# Patient Record
Sex: Male | Born: 1970 | Race: Black or African American | Hispanic: No | Marital: Married | Smoking: Never smoker
Health system: Southern US, Community
[De-identification: ages and names within clinical notes are randomized; demographics above are authoritative.]

## PROBLEM LIST (undated history)

## (undated) DIAGNOSIS — B009 Herpesviral infection, unspecified: Secondary | ICD-10-CM

## (undated) DIAGNOSIS — F32A Depression, unspecified: Secondary | ICD-10-CM

## (undated) DIAGNOSIS — K219 Gastro-esophageal reflux disease without esophagitis: Secondary | ICD-10-CM

## (undated) DIAGNOSIS — E785 Hyperlipidemia, unspecified: Secondary | ICD-10-CM

## (undated) DIAGNOSIS — T7840XA Allergy, unspecified, initial encounter: Secondary | ICD-10-CM

## (undated) DIAGNOSIS — F419 Anxiety disorder, unspecified: Secondary | ICD-10-CM

## (undated) DIAGNOSIS — I10 Essential (primary) hypertension: Secondary | ICD-10-CM

## (undated) DIAGNOSIS — K649 Unspecified hemorrhoids: Secondary | ICD-10-CM

## (undated) HISTORY — DX: Unspecified hemorrhoids: K64.9

## (undated) HISTORY — DX: Anxiety disorder, unspecified: F41.9

## (undated) HISTORY — DX: Essential (primary) hypertension: I10

## (undated) HISTORY — DX: Depression, unspecified: F32.A

## (undated) HISTORY — DX: Gastro-esophageal reflux disease without esophagitis: K21.9

## (undated) HISTORY — DX: Herpesviral infection, unspecified: B00.9

## (undated) HISTORY — DX: Allergy, unspecified, initial encounter: T78.40XA

## (undated) HISTORY — PX: COLONOSCOPY: SHX174

## (undated) HISTORY — DX: Hyperlipidemia, unspecified: E78.5

---

## 1997-09-11 ENCOUNTER — Emergency Department (HOSPITAL_COMMUNITY): Admission: EM | Admit: 1997-09-11 | Discharge: 1997-09-11 | Payer: Self-pay | Admitting: Emergency Medicine

## 2005-11-21 ENCOUNTER — Emergency Department (HOSPITAL_COMMUNITY): Admission: EM | Admit: 2005-11-21 | Discharge: 2005-11-21 | Payer: Self-pay | Admitting: Emergency Medicine

## 2009-10-31 ENCOUNTER — Ambulatory Visit: Payer: Self-pay | Admitting: Family Medicine

## 2009-10-31 DIAGNOSIS — L723 Sebaceous cyst: Secondary | ICD-10-CM

## 2009-11-07 ENCOUNTER — Ambulatory Visit: Payer: Self-pay | Admitting: Family Medicine

## 2010-06-12 NOTE — Assessment & Plan Note (Signed)
Summary: SPOT ON BACK/EVM   Vital Signs:  Patient Profile:   40 Years Old Male CC:      Lump on the middle back area / RWT Height:     67 inches Weight:      192 pounds BMI:     30.18 O2 Sat:      98 % O2 treatment:    Room Air Temp:     97.1 degrees F oral Pulse rate:   74 / minute Pulse rhythm:   regular Resp:     18 per minute BP sitting:   130 / 77  (left arm)  Pt. in pain?   yes    Location:   back    Intensity:   1    Type:       aching  Vitals Entered By: Levonne Spiller EMT-P (October 31, 2009 11:51 AM)              Is Patient Diabetic? No      Current Allergies: No known allergies History of Present Illness History from: patient Reason for visit: see chief complaint Chief Complaint: Lump on the middle back area / RWT History of Present Illness: For about 2 months has noticed lump on the left side of his back. He has not had any drainage from that area. + tenderness only with laying on his back at night. It does not keep him up, he just notices it then. Has never had similar lump. No history of injury to that area.   REVIEW OF SYSTEMS Constitutional Symptoms      Denies fever, chills, night sweats, weight loss, weight gain, and fatigue.  Eyes       Denies change in vision, eye pain, eye discharge, glasses, contact lenses, and eye surgery. Ear/Nose/Throat/Mouth       Denies hearing loss/aids, change in hearing, ear pain, ear discharge, dizziness, frequent runny nose, frequent nose bleeds, sinus problems, sore throat, hoarseness, and tooth pain or bleeding.  Respiratory       Denies dry cough, productive cough, wheezing, shortness of breath, asthma, bronchitis, and emphysema/COPD.  Cardiovascular       Denies murmurs, chest pain, and tires easily with exhertion.    Gastrointestinal       Denies stomach pain, nausea/vomiting, diarrhea, constipation, blood in bowel movements, and indigestion. Genitourniary       Denies painful urination, kidney stones, and loss of  urinary control. Neurological       Denies paralysis, seizures, and fainting/blackouts. Musculoskeletal       Denies muscle pain, joint pain, joint stiffness, decreased range of motion, redness, swelling, muscle weakness, and gout.  Skin       Denies bruising, unusual mles/lumps or sores, and hair/skin or nail changes.  Psych       Denies mood changes, temper/anger issues, anxiety/stress, speech problems, depression, and sleep problems. Blood-Lymph       Complains of unexplained lumps.  Social History: Occupation: Film/video editor Physical Exam General appearance: well developed, well nourished, no acute distress Chest/Lungs: no rales, wheezes, or rhonchi bilateral, breath sounds equal without effort Heart: regular rate and  rhythm, no murmur Back: no tenderness over musculature Skin: about 1 cm mobile lump at left thoracic area to the left of the spine MSE: oriented to time, place, and person Assessment New Problems: SEBACEOUS CYST (ICD-706.2)   The patient and/or caregiver has been counseled thoroughly with regard to medications prescribed including dosage, schedule, interactions, rationale for use, and possible side  effects and they verbalize understanding.  Diagnoses and expected course of recovery discussed and will return if not improved as expected or if the condition worsens. Patient and/or caregiver verbalized understanding.   PROCEDURE:  Lesion Removal Site: left thoracic area Size: 1cm Number of Lesions: 1 Anesthesia: lidocaine with epi - 1 cc Procedure: After sterilizing the area with betadine, anesthesia was achieved and tested. A scapel was used to remove 2 pea-sized sebaceous cysts. Hemostasis was easily achieved and 3, 4-0 vicryl sutures were placed. A pressure bandage was placed. Patient tolerated the proceedure well.  Signs/Symptoms: tenderness Physical evidence: edema Disposition: Return to clinic as Instructed by MD  Orders Added: 1)  New Patient Level III  [99203]  The risks, benefits and possible side effects of the treatments and tests were explained clearly to the patient and the patient verbalized understanding.  The patient was informed that there is no on-call provider or services available at this clinic during off-hours (when the clinic is closed).  If the patient developed a problem or concern that required immediate attention, the patient was advised to go the the nearest available urgent care or emergency department for medical care.  The patient verbalized understanding.

## 2010-06-12 NOTE — Assessment & Plan Note (Signed)
Summary: remove sutures/jbb   Allergies: No Known Drug Allergies    PROCEDURE:  Suture Removal Site: left upper thoracic area Sutures removed without difficulty. Incision healing well. No redness. No edema. No drainage from incision. comments: no scarring Verbal Pain Scale: 0. ( 0=no pain, 5=worst pain ever) Follow up: 3 sutures removed without difficulty and sterile bandage applied. Told to continue with sterile bandages and alert us/remove if any trouble/concerns develop.     Plan New Orders: Est. Patient Level I [16109] Follow Up: Follow up on an as needed basis  The patient and/or caregiver has been counseled thoroughly with regard to medications prescribed including dosage, schedule, interactions, rationale for use, and possible side effects and they verbalize understanding.  Diagnoses and expected course of recovery discussed and will return if not improved as expected or if the condition worsens. Patient and/or caregiver verbalized understanding.

## 2011-07-05 ENCOUNTER — Ambulatory Visit (INDEPENDENT_AMBULATORY_CARE_PROVIDER_SITE_OTHER): Payer: Managed Care, Other (non HMO) | Admitting: Physician Assistant

## 2011-07-05 VITALS — BP 102/70 | HR 84 | Temp 98.5°F | Resp 16 | Ht 68.5 in | Wt 209.0 lb

## 2011-07-05 DIAGNOSIS — R05 Cough: Secondary | ICD-10-CM

## 2011-07-05 DIAGNOSIS — Z113 Encounter for screening for infections with a predominantly sexual mode of transmission: Secondary | ICD-10-CM

## 2011-07-05 LAB — RPR

## 2011-07-05 MED ORDER — VALACYCLOVIR HCL 1 G PO TABS: 1000.0000 mg | ORAL_TABLET | Freq: Every day | ORAL | Status: DC

## 2011-07-05 MED ORDER — PREDNISONE 20 MG PO TABS: ORAL_TABLET | ORAL | Status: AC

## 2011-07-05 MED ORDER — IPRATROPIUM BROMIDE 0.03 % NA SOLN: 2.0000 | Freq: Two times a day (BID) | NASAL | Status: DC

## 2011-07-05 MED ORDER — BENZONATATE 100 MG PO CAPS: ORAL_CAPSULE | ORAL | Status: AC

## 2011-07-05 NOTE — Patient Instructions (Signed)
Get rest, drink 64 ounces fluid daily.

## 2011-07-06 LAB — GC/CHLAMYDIA PROBE AMP, URINE
Chlamydia, Swab/Urine, PCR: NEGATIVE
GC Probe Amp, Urine: NEGATIVE

## 2011-07-08 ENCOUNTER — Encounter: Payer: Self-pay | Admitting: Physician Assistant

## 2011-07-08 NOTE — Progress Notes (Signed)
  Subjective:    Patient ID: Matthew Cunningham, male    DOB: 1970/12/10, 41 y.o.   MRN: [id number]  HPI This patient presents with cough. Symptoms began about 4 weeks ago. He had a cold at the time. All symptoms have resolved except the cough. Is worse with activity. 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 No fever or chills. No congestion or sore throat. No ear fullness. No headache. No shortness of breath.  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 He would also like STD screening performed today.   Review of Systems As above.    Objective:   Physical Exam Vital signs noted. This is a well-developed, well-nourished black male who is awake, alert and oriented in no acute distress. HEENT, neck, heart, lungs are unremarkable on exam. Peripheral pulses are symmetrically strong and skin is warm and dry.  Uriprobe, HIV, RPR, hepatitis B antibody and antigen are pending.       Assessment & Plan:  1. Cough, post viral Prednisone taper, Atrovent nasal spray and Tessalon Perles. Anticipatory guidance provided.  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 Screening for sexually transmitted infections. I will advise him of the results when they are available.

## 2011-07-10 ENCOUNTER — Ambulatory Visit (INDEPENDENT_AMBULATORY_CARE_PROVIDER_SITE_OTHER): Payer: Managed Care, Other (non HMO) | Admitting: Family Medicine

## 2011-07-10 VITALS — BP 115/74 | HR 69 | Temp 98.8°F | Resp 18 | Ht 65.5 in | Wt 206.4 lb

## 2011-07-10 DIAGNOSIS — R21 Rash and other nonspecific skin eruption: Secondary | ICD-10-CM

## 2011-07-10 DIAGNOSIS — L259 Unspecified contact dermatitis, unspecified cause: Secondary | ICD-10-CM

## 2011-07-10 DIAGNOSIS — L309 Dermatitis, unspecified: Secondary | ICD-10-CM

## 2011-07-10 MED ORDER — TRIAMCINOLONE ACETONIDE 0.1 % EX CREA: TOPICAL_CREAM | Freq: Two times a day (BID) | CUTANEOUS | Status: AC

## 2011-07-10 NOTE — Progress Notes (Signed)
  Patient Name: Matthew Cunningham Date of Birth: 1970/07/27 Medical Record Number: [id number] Gender: male Date of Encounter: 07/10/2011  History of Present Illness:  Matthew Cunningham is a 41 y.o. very pleasant male patient who presents with the following:  Was here recently for an illness- he is feeling much better.  However he now notes an area of scaly, itchy skin on his right flank.  Present for about 4 days.  He does share his bed with his dog so he wonders if he could have caught anything from her.  Also needs to get his recent STD results.  Generally healthy. STD results show that he is not immune to hepatitis B- he is interested in being vaccinated.     Patient Active Problem List  Diagnoses  . SEBACEOUS CYST   Past Medical History  Diagnosis Date  . HSV-2 (herpes simplex virus 2) infection     positive antibodies, no hx of outbreak  . Hemorrhoid     colonoscopy was normal 2012   No past surgical history on file. History  Substance Use Topics  . Smoking status: Never Smoker   . Smokeless tobacco: Not on file  . Alcohol Use: Yes     social drinker   No family history on file. No Known Allergies  Medication list has been reviewed and updated.  Review of Systems: As per HPI, otherwise negative  Physical Examination: Filed Vitals:   07/10/11 0953  BP: 115/74  Pulse: 69  Temp: 98.8 F (37.1 C)  TempSrc: Oral  Resp: 18  Height: 5' 5.5" (1.664 m)  Weight: 206 lb 6.4 oz (93.622 kg)   GEN: WDWN, NAD, Non-toxic, A & O x 3 HEENT: Atraumatic, Normocephalic. Neck supple. No masses, No LAD. Ears and Nose: No external deformity. CV: RRR, No M/G/R. No JVD. No thrill. No extra heart sounds. PULM: CTA B, no wheezes, crackles, rhonchi. No retractions. No resp. distress. No accessory muscle use. Skin: small scaly area on right flank EXTR: No c/c/e NEURO Normal gait.  PSYCH: Normally interactive. Conversant. Not depressed or anxious appearing.  Calm demeanor.   Body mass  index is 33.82 kg/(m^2).   Results for orders placed in visit on 07/10/11  POCT SKIN KOH      Component Value Range   Skin KOH, POC Negative     Assessment and Plan: 1. Dermatitis  triamcinolone cream (KENALOG) 0.1 %  2. Rash  POCT Skin KOH, triamcinolone cream (KENALOG) 0.1 %   Discussed hep B vaccination with patient- he decided to delay for now due to being "afraid of needles." he will let us know if his skin area is not better in a week or so.   Went over recent STI results- all negative.

## 2011-10-01 ENCOUNTER — Ambulatory Visit (INDEPENDENT_AMBULATORY_CARE_PROVIDER_SITE_OTHER): Payer: Managed Care, Other (non HMO) | Admitting: Internal Medicine

## 2011-10-01 VITALS — BP 117/76 | HR 74 | Temp 99.5°F | Resp 16 | Ht 68.0 in | Wt 208.0 lb

## 2011-10-01 DIAGNOSIS — Z708 Other sex counseling: Secondary | ICD-10-CM

## 2011-10-01 DIAGNOSIS — J329 Chronic sinusitis, unspecified: Secondary | ICD-10-CM

## 2011-10-01 DIAGNOSIS — B009 Herpesviral infection, unspecified: Secondary | ICD-10-CM

## 2011-10-01 MED ORDER — AMOXICILLIN 500 MG PO CAPS: 1000.0000 mg | ORAL_CAPSULE | Freq: Two times a day (BID) | ORAL | Status: AC

## 2011-10-01 MED ORDER — VALACYCLOVIR HCL 1 G PO TABS: 1000.0000 mg | ORAL_TABLET | Freq: Every day | ORAL | Status: DC

## 2011-10-01 NOTE — Patient Instructions (Signed)
SFood Poisoning Food poisoning is an illness caused by something you ate or drank. There are over 250 known causes of food poisoning. However, many other causes are unknown.You can be treated even if the exact cause of your food poisoning is not known. In most cases, food poisoning is mild and lasts 1 to 2 days. However, some cases can be serious, especially for people with low immune systems, the elderly, children and infants, and pregnant women. CAUSES  Poor personal hygiene, improper cleaning of storage and preparation areas, and unclean utensils can cause infection or tainting (contamination) of foods. The causes of food poisoning are numerous.Infectious agents, such as viruses, bacteria, or parasites, can cause harm by infecting the intestine and disrupting the absorption of nutrients and water. This can cause diarrhea and lead to dehydration. Viruses are responsible for most of the food poisonings in which an agent is found. Parasites are less likely to cause food poisoning. Toxic agents, such as poisonous mushrooms, marine algae, and pesticides can also cause food poisoning.  Viral causes of food poisoning include:   Norovirus.   Rotavirus.   Hepatitis A.   Bacterial causes of food poisoning include:   Salmonellae.   Campylobacter.   Bacillus cereus.   Escherichia coli (E. coli).   Shigella.   Listeria monocytogenes.   Clostridium botulinum (botulism).   Vibrio cholerae.   Parasites that can cause food poisoning include:   Giardia.   Cryptosporidium.   Toxoplasma.  SYMPTOMS Symptoms may appear several hours or longer after consuming the contaminated food or drink. Symptoms may include:  Nausea.   Vomiting.   Cramping.   Diarrhea.   Fever and chills.   Muscle aches.  DIAGNOSIS Your caregiver may be able to diagnose food poisoning from a list of what you have recently eaten and results from lab tests. Diagnostic tests may include an exam of the  feces. TREATMENT In most cases, treatment focuses on helping to relieve your symptoms and staying well hydrated. Antibiotics are rarely needed. In severe cases, hospitalization may be required. PREVENTION   Wash your hands, food preparation surfaces, and utensils thoroughly before and after handling raw foods.   Keep refrigerated foods below 40 F (5 C).   Serve hot foods immediately or keep them heated above 140 F (60 C).   Divide large volumes of food into small portions for rapid cooling in the refrigerator. Hot, bulky foods in the refrigerator can raise the temperature of other foods that have already cooled.   Follow approved canning procedures.   Heat canned foods thoroughly before tasting.   When in doubt, throw it out.   Infants, the elderly, women who are pregnant, and people with compromised immune systems are especially susceptible to food poisoning. These people should never consume unpasteurized cheese, unpasteurized cider, raw fish, raw seafood, or raw meat type products.  HOME CARE INSTRUCTIONS   Drink enough water and fluids to keep your urine clear or pale yellow. Drink small amounts of fluids frequently and increase as tolerated.   Ask your caregiver for specific rehydration instructions.   Avoid:   Foods high in sugar.   Alcohol.   Carbonated drinks.   Tobacco.   Juice.   Caffeine drinks.   Extremely hot or cold fluids.   Fatty, greasy foods.   Too much intake of anything at one time.   Dairy products until 24 to 48 hours after diarrhea stops.   You may consume probiotics. Probiotics are active cultures of beneficial bacteria.  They may lessen the amount and number of diarrheal stools in adults. Probiotics can be found in yogurt with active cultures and in supplements.   Wash your hands well to avoid spreading the bacteria.   Only take over-the-counter or prescription medicines for pain, discomfort, or fever as directed by your caregiver. Do  not give aspirin to children.   Ask your caregiver if you should continue to take your regular prescribed and over-the-counter medicines.  SEEK IMMEDIATE MEDICAL CARE IF:   You have difficulty breathing, swallowing, talking, or moving.   You develop blurred vision.   You are unable to keep fluids down.   You faint or nearly faint.   Your eyes turn yellow.   Vomiting or diarrhea develops or becomes persistent.   Abdominal pain develops, increases, or localizes in one small area.   You have a fever.   The diarrhea becomes excessive or contains blood or mucus.   You develop excessive weakness, dizziness, or extreme thirst.   You have no urine for 8 hours.  MAKE SURE YOU:   Understand these instructions.   Will watch your condition.   Will get help right away if you are not doing well or get worse.  Document Released: 01/26/2004 Document Revised: 04/18/2011 Document Reviewed: 09/13/2010 [employer] Patient Information 2012 [employer].inusitis Sinuses are air pockets within the bones of your face. The growth of bacteria within a sinus leads to infection. The infection prevents the sinuses from draining. This infection is called sinusitis. SYMPTOMS  There will be different areas of pain depending on which sinuses have become infected.  The maxillary sinuses often produce pain beneath the eyes.   Frontal sinusitis may cause pain in the middle of the forehead and above the eyes.  Other problems (symptoms) include:  Toothaches.   Colored, pus-like (purulent) drainage from the nose.   Swelling, warmth, and tenderness over the sinus areas may be signs of infection.  TREATMENT  Sinusitis is most often determined by an exam.X-rays may be taken. If x-rays have been taken, make sure you obtain your results or find out how you are to obtain them. Your caregiver may give you medications (antibiotics). These are medications that will help kill the bacteria causing the infection. You  may also be given a medication (decongestant) that helps to reduce sinus swelling.  HOME CARE INSTRUCTIONS   Only take over-the-counter or prescription medicines for pain, discomfort, or fever as directed by your caregiver.   Drink extra fluids. Fluids help thin the mucus so your sinuses can drain more easily.   Applying either moist heat or ice packs to the sinus areas may help relieve discomfort.   Use saline nasal sprays to help moisten your sinuses. The sprays can be found at your local drugstore.  SEEK IMMEDIATE MEDICAL CARE IF:  You have a fever.   You have increasing pain, severe headaches, or toothache.   You have nausea, vomiting, or drowsiness.   You develop unusual swelling around the face or trouble seeing.  MAKE SURE YOU:   Understand these instructions.   Will watch your condition.   Will get help right away if you are not doing well or get worse.  Document Released: 04/29/2005 Document Revised: 04/18/2011 Document Reviewed: 11/26/2006 [facility] Patient Information 2012 [employer], [place].

## 2011-10-01 NOTE — Progress Notes (Signed)
  Subjective:    Patient ID: Matthew Cunningham, male    DOB: 04-20-1971, 41 y.o.   MRN: [id number]  HPI Has nasal and facial congestion  1 week, no chest sxs. Had food poisoning, vomiting and diarrhea lasted one day last weekend. No fever   Review of Systems    neg Objective:   Physical Exam  Constitutional: He is oriented to person, place, and time.  HENT:  Right Ear: External ear normal.  Left Ear: External ear normal.  Nose: Mucosal edema, rhinorrhea and sinus tenderness present. Right sinus exhibits maxillary sinus tenderness and frontal sinus tenderness. Left sinus exhibits no maxillary sinus tenderness and no frontal sinus tenderness.  Mouth/Throat: Oropharynx is clear and moist.  Pulmonary/Chest: Effort normal and breath sounds normal.  Abdominal: Bowel sounds are normal.  Neurological: He is alert and oriented to person, place, and time. Coordination normal.          Assessment & Plan:  Sinusitis Amoxil 1 g BID

## 2011-12-11 ENCOUNTER — Ambulatory Visit (INDEPENDENT_AMBULATORY_CARE_PROVIDER_SITE_OTHER): Payer: Managed Care, Other (non HMO) | Admitting: Family Medicine

## 2011-12-11 ENCOUNTER — Ambulatory Visit: Payer: Managed Care, Other (non HMO)

## 2011-12-11 VITALS — BP 132/74 | HR 80 | Temp 98.2°F | Resp 17 | Ht 67.5 in | Wt 208.0 lb

## 2011-12-11 DIAGNOSIS — M25529 Pain in unspecified elbow: Secondary | ICD-10-CM

## 2011-12-11 DIAGNOSIS — Z113 Encounter for screening for infections with a predominantly sexual mode of transmission: Secondary | ICD-10-CM

## 2011-12-11 MED ORDER — MELOXICAM 7.5 MG PO TABS: ORAL_TABLET | ORAL | Status: DC

## 2011-12-11 NOTE — Patient Instructions (Addendum)
Use the mobic (one or two pills) daily as needed for elbow pain.  If your elbow is not better in a week or so let me know.    Bring in your urine this afternoon- show this sheet to front desk  Please give urine to lab- for ureaprobe

## 2011-12-11 NOTE — Progress Notes (Signed)
[facility] [address], [place] [address] [phone]  Date:  12/11/2011   Name:  Matthew Cunningham   DOB:  Jul 12, 1970   MRN:  [id number]  PCP:  No primary provider on file.    Chief Complaint: Elbow Injury   History of Present Illness:  Matthew Cunningham is a 41 y.o. very pleasant male patient who presents with the following:  He "hurt" his left elbow a few weeks ago- he still notes a knot in the lateral elbow, and his GF has noted some swelling.  He did not have any particular injury, but has noted fluctuating pain.  Extending the elbow can be painful.  He is not aware of any new or repitive motions that could have injured his elbow.  Never had this in the past.    He is right handed.   He has not been taking any medications for this so far  Patient Active Problem List  Diagnosis  . SEBACEOUS CYST    Past Medical History  Diagnosis Date  . HSV-2 (herpes simplex virus 2) infection     positive antibodies, no hx of outbreak  . Hemorrhoid     colonoscopy was normal 2012    No past surgical history on file.  History  Substance Use Topics  . Smoking status: Never Smoker   . Smokeless tobacco: Not on file  . Alcohol Use: Yes     social drinker    No family history on file.  No Known Allergies  Medication list has been reviewed and updated.  Current Outpatient Prescriptions on File Prior to Visit  Medication Sig Dispense Refill  . ipratropium (ATROVENT) 0.03 % nasal spray Place 2 sprays into the nose 2 (two) times daily.  30 mL  0  . triamcinolone cream (KENALOG) 0.1 % Apply topically 2 (two) times daily.  30 g  0  . valACYclovir (VALTREX) 1000 MG tablet Take 1 tablet (1,000 mg total) by mouth daily.  90 tablet  3    Review of Systems:  As per HPI- otherwise negative.   Physical Examination: Filed Vitals:   12/11/11 1119  BP: 132/74  Pulse: 80  Temp: 98.2 F (36.8 C)  Resp: 17   Filed Vitals:   12/11/11 1119  Height: 5' 7.5"  (1.715 m)  Weight: 208 lb (94.348 kg)   Body mass index is 32.10 kg/(m^2). Ideal Body Weight: Weight in (lb) to have BMI = 25: 161.7   GEN: WDWN, NAD, Non-toxic, A & O x 3 HEENT: Atraumatic, Normocephalic. Neck supple. No masses, No LAD. Ears and Nose: No external deformity. CV: RRR, No M/G/R. No JVD. No thrill. No extra heart sounds. PULM: CTA B, no wheezes, crackles, rhonchi. No retractions. No resp. distress. No accessory muscle use. EXTR: No c/c/e NEURO Normal gait.  PSYCH: Normally interactive. Conversant. Not depressed or anxious appearing.  Calm demeanor.  Left elbow: mildly tender over lateral elbow and lateral epicondyle.  He has no pain with resisted supination or pronation, but does have pain with full extension.    [facility] reading (PRIMARY) by  Dr. Patsy Lager.  Negative elbow LEFT ELBOW - COMPLETE 3+ VIEW  Comparison: None  Findings: No effusion. Negative for fracture, dislocation, or other acute abnormality. Normal alignment and mineralization. No significant degenerative change. Regional soft tissues unremarkable.  IMPRESSION:  Negative  Assessment and Plan: 1. Elbow pain  DG Elbow Complete Left, meloxicam (MOBIC) 7.5 MG tablet  2. Screen for STD (sexually transmitted disease)  RPR, HIV antibody, Hepatitis C antibody, Hepatitis B surface antigen, GC/chlamydia probe amp, urine   Suspect that Deval has lateral epicondylitis.  Will try mobic as above- if not better in a week or two he will let me know and I will refer him to ortho.    STI testing as above per his request  Abbe Amsterdam, MD

## 2011-12-12 ENCOUNTER — Encounter: Payer: Self-pay | Admitting: Family Medicine

## 2011-12-12 LAB — GC/CHLAMYDIA PROBE AMP, URINE
Chlamydia, Swab/Urine, PCR: NEGATIVE
GC Probe Amp, Urine: NEGATIVE

## 2011-12-12 LAB — HIV ANTIBODY (ROUTINE TESTING W REFLEX): HIV: NONREACTIVE

## 2011-12-12 LAB — HEPATITIS B SURFACE ANTIGEN: Hepatitis B Surface Ag: NEGATIVE

## 2012-10-27 ENCOUNTER — Encounter (HOSPITAL_COMMUNITY): Payer: Self-pay

## 2012-10-27 ENCOUNTER — Emergency Department (HOSPITAL_COMMUNITY)
Admission: EM | Admit: 2012-10-27 | Discharge: 2012-10-27 | Disposition: A | Discharge status: Home / Self Care | Payer: Managed Care, Other (non HMO) | Attending: Emergency Medicine | Admitting: Emergency Medicine

## 2012-10-27 DIAGNOSIS — Z8679 Personal history of other diseases of the circulatory system: Secondary | ICD-10-CM | POA: Insufficient documentation

## 2012-10-27 DIAGNOSIS — R52 Pain, unspecified: Secondary | ICD-10-CM

## 2012-10-27 DIAGNOSIS — Z79899 Other long term (current) drug therapy: Secondary | ICD-10-CM | POA: Insufficient documentation

## 2012-10-27 DIAGNOSIS — R142 Eructation: Secondary | ICD-10-CM | POA: Insufficient documentation

## 2012-10-27 DIAGNOSIS — Z8619 Personal history of other infectious and parasitic diseases: Secondary | ICD-10-CM | POA: Insufficient documentation

## 2012-10-27 DIAGNOSIS — R141 Gas pain: Secondary | ICD-10-CM | POA: Insufficient documentation

## 2012-10-27 LAB — CBC WITH DIFFERENTIAL/PLATELET
Basophils Absolute: 0 10*3/uL (ref 0.0–0.1)
Basophils Relative: 0 % (ref 0–1)
HCT: 44 % (ref 39.0–52.0)
Hemoglobin: 15.3 g/dL (ref 13.0–17.0)
Lymphs Abs: 1.4 10*3/uL (ref 0.7–4.0)
MCH: 29.1 pg (ref 26.0–34.0)
MCHC: 34.8 g/dL (ref 30.0–36.0)
Monocytes Relative: 10 % (ref 3–12)
Neutrophils Relative %: 74 % (ref 43–77)
RBC: 5.26 MIL/uL (ref 4.22–5.81)
RDW: 12.8 % (ref 11.5–15.5)
WBC: 8.5 10*3/uL (ref 4.0–10.5)

## 2012-10-27 LAB — POCT I-STAT, CHEM 8
Calcium, Ion: 1.11 mmol/L — ABNORMAL LOW (ref 1.12–1.23)
Hemoglobin: 17 g/dL (ref 13.0–17.0)
Potassium: 3.9 mEq/L (ref 3.5–5.1)

## 2012-10-27 NOTE — ED Notes (Signed)
Pt dc to home. Pt sts understanding to dc instructions. Pt ambulatory to exit without difficulty. 

## 2012-10-27 NOTE — ED Provider Notes (Signed)
Medical screening examination/treatment/procedure(s) were performed by non-physician practitioner and as supervising physician I was immediately available for consultation/collaboration.   Cynia Abruzzo, MD 10/27/12 2353 

## 2012-10-27 NOTE — ED Provider Notes (Signed)
History  This chart was scribed for Trixie Dredge, PA-C, working with Glynn Octave, MD by Ardelia Mems, ED Scribe. This patient was seen in room TR07C/TR07C and the patient's care was started at 9:23 PM.   CSN: [id number]  Arrival date & time 10/27/12  2043     Chief Complaint  Patient presents with  . Chills  . Fever     The history is provided by the patient. No language interpreter was used.    HPI Comments: Matthew Cunningham is a 42 y.o. male presents to the Emergency Department complaining of a sudden onset of gradually worsening fever and chills starting about 3 hours ago. Pt reports his first oral temp was 99.662F, second was 100.21F, and last oral temp was 101.50F, 15 minutes PTA. Pt reports taking Motrin 600 mg PTA. Triage Temp is 99.62F. Pt states that earlier today he had no symptoms. Pt states that his fever and chills are not present now, and his only current complaint is feeling bloated. Pt denies sore throat, nasal congestion, ear pain, SOB, cough, chest pain, abdominal pain, nausea, vomiting, diarrhea, dysuria, urinary frequency or urgency, back pain, neck pain, rash, weakness, numbness or any other symptoms. Pt denies hx of abdominal surgery. Pt denies recent tick bites.  PCP- None   Past Medical History  Diagnosis Date  . HSV-2 (herpes simplex virus 2) infection     positive antibodies, no hx of outbreak  . Hemorrhoid     colonoscopy was normal 2012    History reviewed. No pertinent past surgical history.  History reviewed. No pertinent family history.  History  Substance Use Topics  . Smoking status: Never Smoker   . Smokeless tobacco: Not on file  . Alcohol Use: Yes     Comment: social drinker      Review of Systems  Constitutional: Positive for fever and chills.  HENT: Negative for ear pain, congestion, sore throat and neck pain.   Respiratory: Negative for cough and shortness of breath.   Cardiovascular: Negative for chest pain.  Gastrointestinal:  Negative for nausea, vomiting, abdominal pain and diarrhea.       Bloating.  Genitourinary: Negative for dysuria and frequency.  Musculoskeletal: Negative for back pain.  Skin: Negative for rash.  Neurological: Negative for weakness and numbness.   As per HPI  Allergies  Review of patient's allergies indicates no known allergies.  Home Medications   Current Outpatient Rx  Name  Route  Sig  Dispense  Refill  . valACYclovir (VALTREX) 1000 MG tablet   Oral   Take 1 tablet (1,000 mg total) by mouth daily.   90 tablet   3     Triage Vitals: BP 125/79  Pulse 90  Temp(Src) 99.5 F (37.5 C) (Oral)  Resp 18  Ht 5\' 8"  (1.727 m)  Wt 210 lb (95.255 kg)  BMI 31.94 kg/m2  SpO2 100%  Physical Exam  Nursing note and vitals reviewed. Constitutional: He appears well-developed and well-nourished. No distress.  HENT:  Head: Normocephalic and atraumatic.  Mouth/Throat: Oropharynx is clear and moist. No oropharyngeal exudate.  Eyes: Conjunctivae are normal.  Neck: Normal range of motion. Neck supple.  Cardiovascular: Normal rate and regular rhythm.   Pulmonary/Chest: Effort normal and breath sounds normal. No stridor. No respiratory distress. He has no wheezes. He has no rales.  Abdominal: Soft. He exhibits no distension and no mass. There is no tenderness. There is no rebound and no guarding.  Musculoskeletal:  Spine nontender, no crepitus or stepoffs  Lymphadenopathy:    He has no cervical adenopathy.  Neurological: He is alert. He exhibits normal muscle tone.  Skin: No rash noted. He is not diaphoretic.    ED Course  Procedures (including critical care time)  DIAGNOSTIC STUDIES: Oxygen Saturation is 100% on RA, normal by my interpretation.    COORDINATION OF CARE: 9:50 PM- Pt advised of plan for treatment and pt agrees.   Labs Reviewed  POCT I-STAT, CHEM 8 - Abnormal; Notable for the following:    Creatinine, Ser 1.50 (*)    Calcium, Ion 1.11 (*)    All other  components within normal limits  CBC WITH DIFFERENTIAL  URINALYSIS, ROUTINE W REFLEX MICROSCOPIC   No results found.   1. Body aches     MDM  Pt with three hours of fever (at home), relieved with ibuprofen.  No associated symptoms.  Likely early process, whether viral syndrome or other process.  Discussed this with patient and advised return for worsening or concerning symptoms. Discussed findings, treatment with patient.  Pt given return precautions.  Pt verbalizes understanding and agrees with plan.       I personally performed the services described in this documentation, which was scribed in my presence. The recorded information has been reviewed and is accurate.   Trixie Dredge, PA-C 10/27/12 2237

## 2012-10-27 NOTE — ED Notes (Signed)
Pt reports sudden onset of "feeling bad," fever, and chills starting approx 1800 this pm, pt reports his first oral temp 99.9, 2nd te,p 100.4, and last temp 101.0. Pt reports taking Motrin 600 mg PTA

## 2012-11-11 ENCOUNTER — Ambulatory Visit (INDEPENDENT_AMBULATORY_CARE_PROVIDER_SITE_OTHER): Payer: Managed Care, Other (non HMO) | Admitting: Family Medicine

## 2012-11-11 VITALS — BP 108/70 | HR 68 | Temp 98.7°F | Resp 18 | Ht 68.0 in | Wt 208.0 lb

## 2012-11-11 DIAGNOSIS — B9789 Other viral agents as the cause of diseases classified elsewhere: Secondary | ICD-10-CM

## 2012-11-11 DIAGNOSIS — R944 Abnormal results of kidney function studies: Secondary | ICD-10-CM

## 2012-11-11 DIAGNOSIS — B009 Herpesviral infection, unspecified: Secondary | ICD-10-CM

## 2012-11-11 DIAGNOSIS — R768 Other specified abnormal immunological findings in serum: Secondary | ICD-10-CM

## 2012-11-11 DIAGNOSIS — R894 Abnormal immunological findings in specimens from other organs, systems and tissues: Secondary | ICD-10-CM

## 2012-11-11 DIAGNOSIS — B349 Viral infection, unspecified: Secondary | ICD-10-CM

## 2012-11-11 LAB — BASIC METABOLIC PANEL
BUN: 10 mg/dL (ref 6–23)
CO2: 27 mEq/L (ref 19–32)
Chloride: 102 mEq/L (ref 96–112)
Creat: 1.42 mg/dL — ABNORMAL HIGH (ref 0.50–1.35)
Glucose, Bld: 91 mg/dL (ref 70–99)

## 2012-11-11 LAB — BASIC METABOLIC PANEL WITH GFR
Calcium: 9.7 mg/dL (ref 8.4–10.5)
Potassium: 4.5 meq/L (ref 3.5–5.3)
Sodium: 141 meq/L (ref 135–145)

## 2012-11-11 MED ORDER — VALACYCLOVIR HCL 1 G PO TABS: 1000.0000 mg | ORAL_TABLET | Freq: Every day | ORAL | Status: DC

## 2012-11-11 NOTE — Progress Notes (Signed)
[facility]:  Office Visit  Chief Complaint:  Chief Complaint  Patient presents with  . Follow-up    ED visit, still not feeling well with headache, body aches started last night   . rx refill    valtrex     HPI: Matthew Cunningham is a 42 y.o. male who complains of generalized body aches, stomach pain and diarrhea every 30 min and HA, had fever Tmax 100-100.1 on 6/17 and so he went to ER.Marland Kitchen He also states that he had just driven from [place] and had not slept in 48 hrs at that time. 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 He took Advil 600 mg for his fever  before going to ER. Last night he had similar sxs of body aches and pain and so he is here today. Currently he is feeling better. HE has not had any new meds.  Past Medical History  Diagnosis Date  . HSV-2 (herpes simplex virus 2) infection     positive antibodies, no hx of outbreak  . Hemorrhoid     colonoscopy was normal 2012   History reviewed. No pertinent past surgical history. History   Social History  . Marital Status: Single    Spouse Name: N/A    Number of Children: N/A  . Years of Education: N/A   Social History Main Topics  . Smoking status: Never Smoker   . Smokeless tobacco: None  . Alcohol Use: Yes     Comment: social drinker  . Drug Use: No  . Sexually Active: Yes   Other Topics Concern  . None   Social History Narrative  . None   Family History  Problem Relation Age of Onset  . Hypertension Paternal Grandmother   . Hypertension Paternal Grandfather    No Known Allergies Prior to Admission medications   Medication Sig Start Date End Date Taking? Authorizing Provider  valACYclovir (VALTREX) 1000 MG tablet Take 1 tablet (1,000 mg total) by mouth daily. 10/01/11  Yes Jonita Albee, MD     ROS: The patient denies night sweats, unintentional weight loss, chest pain, palpitations, wheezing, dyspnea on exertion, nausea, vomiting, abdominal pain, dysuria,  hematuria, melena, numbness, weakness, or tingling.  All other systems have been reviewed and were otherwise negative with the exception of those mentioned in the HPI and as above.    PHYSICAL EXAM: Filed Vitals:   11/11/12 0926  BP: 108/70  Pulse: 68  Temp: 98.7 F (37.1 C)  Resp: 18   Filed Vitals:   11/11/12 0926  Height: 5\' 8"  (1.727 m)  Weight: 208 lb (94.348 kg)   Body mass index is 31.63 kg/(m^2).  General: Alert, no acute distress HEENT:  Normocephalic, atraumatic, oropharynx patent.  Tm nl. No sinus tenderness, + no exudates.  Cardiovascular:  Regular rate and rhythm, no rubs murmurs or gallops.  No Carotid bruits, radial pulse intact. No pedal edema.  Respiratory: Clear to auscultation bilaterally.  No wheezes, rales, or rhonchi.  No cyanosis, no use of accessory musculature GI: No organomegaly, abdomen is soft and non-tender, positive bowel sounds.  No masses. Skin: No rashes. Neurologic: Facial musculature symmetric. Psychiatric: Patient is appropriate throughout our interaction. Lymphatic: No cervical lymphadenopathy Musculoskeletal: Gait intact.   LABS: Results for orders placed in visit on 11/11/12  BASIC METABOLIC PANEL      Result Value Range   Sodium 141  135 - 145 mEq/L   Potassium 4.5  3.5 -  5.3 mEq/L   Chloride 102  96 - 112 mEq/L   CO2 27  19 - 32 mEq/L   Glucose, Bld 91  70 - 99 mg/dL   BUN 10  6 - 23 mg/dL   Creat 1.61 (*) 0.96 - 1.35 mg/dL   Calcium 9.7  8.4 - 04.5 mg/dL     EKG/XRAY:   Primary read interpreted by Dr. Conley Rolls at [facility].   ASSESSMENT/PLAN: Encounter Diagnoses  Name Primary?  . Viral illness Yes  . Kidney function test abnormal   . HSV (herpes simplex virus) infection   . HSV-2 seropositive    ? Viral illness Labs pending, will repeat BMP for creatinine since it was elevated for him on ER visit No NSAIDs for now, take tylenol prn Refilled Valtrex Push fluids F/u prn    Ashaz Robling PHUONG, DO 11/12/2012 2:00 PM

## 2013-06-06 ENCOUNTER — Ambulatory Visit (INDEPENDENT_AMBULATORY_CARE_PROVIDER_SITE_OTHER): Payer: Managed Care, Other (non HMO) | Admitting: Family Medicine

## 2013-06-06 ENCOUNTER — Ambulatory Visit: Payer: Managed Care, Other (non HMO)

## 2013-06-06 VITALS — BP 120/68 | HR 80 | Temp 98.8°F | Resp 16 | Ht 67.5 in | Wt 214.0 lb

## 2013-06-06 DIAGNOSIS — S20219A Contusion of unspecified front wall of thorax, initial encounter: Secondary | ICD-10-CM

## 2013-06-06 DIAGNOSIS — S2239XA Fracture of one rib, unspecified side, initial encounter for closed fracture: Secondary | ICD-10-CM

## 2013-06-06 DIAGNOSIS — R071 Chest pain on breathing: Secondary | ICD-10-CM

## 2013-06-06 DIAGNOSIS — R0789 Other chest pain: Secondary | ICD-10-CM

## 2013-06-06 MED ORDER — HYDROCODONE-ACETAMINOPHEN 5-325 MG PO TABS: 1.0000 | ORAL_TABLET | Freq: Four times a day (QID) | ORAL | Status: DC | PRN

## 2013-06-06 NOTE — Patient Instructions (Signed)
Your xray appears to possibly have a right 10th rib fracture, but radiologist will also read xrays. Take OTC Ibuprofen up to 600 mg every 6 hours with food. If this does not help, Hydrocodone was also prescribed. This medication can cause sedation so avoid driving, or operating heavy machinery while taking this medicine. See other information below. Return to the clinic or go to the nearest emergency room if any of your symptoms worsen or new symptoms occur. Rib Fracture A rib fracture is a break or crack in one of the bones of the ribs. The ribs are a group of long, curved bones that wrap around your chest and attach to your spine. They protect your lungs and other organs in the chest cavity. A broken or cracked rib is often painful, but most do not cause other problems. Most rib fractures heal on their own over time. However, rib fractures can be more serious if multiple ribs are broken or if broken ribs move out of place and push against other structures. CAUSES   A direct blow to the chest. For example, this could happen during contact sports, a car accident, or a fall against a hard object.  Repetitive movements with high force, such as pitching a baseball or having severe coughing spells. SYMPTOMS   Pain when you breathe in or cough.  Pain when someone presses on the injured area. DIAGNOSIS  Your caregiver will perform a physical exam. Various imaging tests may be ordered to confirm the diagnosis and to look for related injuries. These tests may include a chest X-ray, computed tomography (CT), magnetic resonance imaging (MRI), or a bone scan. TREATMENT  Rib fractures usually heal on their own in 1 3 months. The longer healing period is often associated with a continued cough or other aggravating activities. During the healing period, pain control is very important. Medication is usually given to control pain. Hospitalization or surgery may be needed for more severe injuries, such as those in  which multiple ribs are broken or the ribs have moved out of place.  HOME CARE INSTRUCTIONS   Avoid strenuous activity and any activities or movements that cause pain. Be careful during activities and avoid bumping the injured rib.  Gradually increase activity as directed by your caregiver.  Only take over-the-counter or prescription medications as directed by your caregiver. Do not take other medications without asking your caregiver first.  Apply ice to the injured area for the first 1 2 days after you have been treated or as directed by your caregiver. Applying ice helps to reduce inflammation and pain.  Put ice in a plastic bag.  Place a towel between your skin and the bag.   Leave the ice on for 15 20 minutes at a time, every 2 hours while you are awake.  Perform deep breathing as directed by your caregiver. This will help prevent pneumonia, which is a common complication of a broken rib. Your caregiver may instruct you to:  Take deep breaths several times a day.  Try to cough several times a day, holding a pillow against the injured area.  Use a device called an incentive spirometer to practice deep breathing several times a day.  Drink enough fluids to keep your urine clear or pale yellow. This will help you avoid constipation.   Do not wear a rib belt or binder. These restrict breathing, which can lead to pneumonia.  SEEK IMMEDIATE MEDICAL CARE IF:   You have a fever.   You have  difficulty breathing or shortness of breath.   You develop a continual cough, or you cough up thick or bloody sputum.  You feel sick to your stomach (nausea), throw up (vomit), or have abdominal pain.   You have worsening pain not controlled with medications.  MAKE SURE YOU:  Understand these instructions.  Will watch your condition.  Will get help right away if you are not doing well or get worse. Document Released: 04/29/2005 Document Revised: 12/30/2012 Document Reviewed:  07/01/2012 [facility] Patient Information 2014 [place], [place].
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
## 2013-12-10 ENCOUNTER — Other Ambulatory Visit: Payer: Self-pay | Admitting: Family Medicine

## 2014-05-29 ENCOUNTER — Ambulatory Visit (INDEPENDENT_AMBULATORY_CARE_PROVIDER_SITE_OTHER): Payer: BLUE CROSS/BLUE SHIELD | Admitting: Family Medicine

## 2014-05-29 VITALS — BP 122/78 | HR 68 | Temp 98.9°F | Resp 16 | Ht 68.0 in | Wt 214.4 lb

## 2014-05-29 DIAGNOSIS — J0100 Acute maxillary sinusitis, unspecified: Secondary | ICD-10-CM

## 2014-05-29 MED ORDER — AMOXICILLIN 875 MG PO TABS: 875.0000 mg | ORAL_TABLET | Freq: Two times a day (BID) | ORAL | Status: DC

## 2014-05-29 NOTE — Patient Instructions (Signed)

## 2014-05-29 NOTE — Progress Notes (Signed)
° °  Subjective:  This chart was scribed for Elvina SidleKurt Lauenstein MD, by Veverly FellsHatice Demirci,scribe, at [facility].  This patient was seen in room 4 and the patient's care was started at 11:26 AM.   Patient ID: Matthew Cunningham, male    DOB: 1970-12-17, 44 y.o.   MRN: [id number]  HPI   HPI Comments: Matthew Cunningham is a 44 y.o. male who [facility] for a cough, congestion and sore throat for the past two weeks. Patient has been taking Nyquil and notes that it is working.   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  Patient denies any allergies.       Patient Active Problem List   Diagnosis Date Noted   SEBACEOUS CYST 10/31/2009   Past Medical History  Diagnosis Date   HSV-2 (herpes simplex virus 2) infection     positive antibodies, no hx of outbreak   Hemorrhoid     colonoscopy was normal 2012   No past surgical history on file. No Known Allergies Prior to Admission medications   Medication Sig Start Date End Date Taking? Authorizing Provider  HYDROcodone-acetaminophen (NORCO/VICODIN) 5-325 MG per tablet Take 1 tablet by mouth every 6 (six) hours as needed for moderate pain. 06/06/13  Yes Shade FloodJeffrey R Greene, MD  valACYclovir (VALTREX) 1000 MG tablet TAKE 1 TABLET BY MOUTH DAILY 12/10/13  Yes Nelva NayHeather M Marte, PA-C   History   Social History   Marital Status: Single    Spouse Name: N/A    Number of Children: N/A   Years of Education: N/A   Occupational History   Not on file.   Social History Main Topics   Smoking status: Never Smoker    Smokeless tobacco: Not on file   Alcohol Use: Yes     Comment: social drinker   Drug Use: No   Sexual Activity: Yes   Other Topics Concern   Not on file   Social History Narrative      Review of Systems  Constitutional: Negative for fever and chills.  HENT: Positive for congestion and sore throat.   Respiratory: Positive for cough.        Objective:   Physical Exam  General appearance is  that of a healthy well-developed muscular young adult male. HEENT: normal TMs, U Cooper on discharge with swelling both nasal passages, normal oropharynx. Neck: Supple, no adenopathy, no thyromegaly Chest: Clear to auscultation Skin: Warm and dry   Filed Vitals:   05/29/14 1116  BP: 122/78  Pulse: 68  Temp: 98.9 F (37.2 C)  TempSrc: Oral  Resp: 16  Height: 5\' 8"  (1.727 m)  Weight: 214 lb 6.4 oz (97.251 kg)  SpO2: 98%       Assessment & Plan:    This chart was scribed in my presence and reviewed by me personally.    ICD-9-CM ICD-10-CM   1. Acute maxillary sinusitis, recurrence not specified 461.0 J01.00 amoxicillin (AMOXIL) 875 MG tablet     Signed, Elvina SidleKurt Lauenstein, MD

## 2014-06-07 ENCOUNTER — Ambulatory Visit (INDEPENDENT_AMBULATORY_CARE_PROVIDER_SITE_OTHER): Payer: BLUE CROSS/BLUE SHIELD | Admitting: Family Medicine

## 2014-06-07 VITALS — BP 112/66 | HR 85 | Temp 99.0°F | Resp 18 | Ht 68.0 in | Wt 215.8 lb

## 2014-06-07 DIAGNOSIS — J0121 Acute recurrent ethmoidal sinusitis: Secondary | ICD-10-CM

## 2014-06-07 DIAGNOSIS — H6122 Impacted cerumen, left ear: Secondary | ICD-10-CM

## 2014-06-07 MED ORDER — PREDNISONE 20 MG PO TABS: ORAL_TABLET | ORAL | Status: DC

## 2014-06-07 MED ORDER — FLUTICASONE PROPIONATE 50 MCG/ACT NA SUSP: 2.0000 | Freq: Every day | NASAL | Status: DC

## 2014-06-07 MED ORDER — AZITHROMYCIN 250 MG PO TABS: ORAL_TABLET | ORAL | Status: DC

## 2014-06-07 NOTE — Progress Notes (Signed)
Subjective:  This chart was scribed for Norberto SorensonEva Shaw, MD by Evon Slackerrance Branch, ED Scribe. This Patient was seen in room 11 and the patients care was started at 12:35 PM    Patient ID: Matthew Cunningham, male    DOB: [birth year], 44 y.o.   MRN: [id number]  Chief Complaint  Patient presents with  . Follow-up  . Sinusitis    HPI HPI Comments: Matthew Cunningham is a 44 y.o. male who presents to the [facility] complaining of sinus congestion onset 1 month prior. Pt states that he has associated productive cough or clear sputum, ear pain and a slight sore throat.  Pt states that he has been taking amoxicillin that was initially providing relief but now is not providing any relief. Pt states that he has tried Nyquil last night that porvided temporary relief. Denies wheezing, fever,chills, headaches or vision changes.      Past Medical History  Diagnosis Date  . HSV-2 (herpes simplex virus 2) infection     positive antibodies, no hx of outbreak  . Hemorrhoid     colonoscopy was normal 2012   Current Outpatient Prescriptions on File Prior to Visit  Medication Sig Dispense Refill  . valACYclovir (VALTREX) 1000 MG tablet TAKE 1 TABLET BY MOUTH DAILY 90 tablet 1   No current facility-administered medications on file prior to visit.   No Known Allergies  Review of Systems  Constitutional: Negative for fever and chills.  Eyes: Negative for visual disturbance.  Respiratory: Positive for cough. Negative for wheezing.   Neurological: Negative for headaches.     Objective:   BP 112/66 mmHg  Pulse 85  Temp(Src) 99 F (37.2 C) (Oral)  Resp 18  Ht 5\' 8"  (1.727 m)  Wt 215 lb 12.8 oz (97.886 kg)  BMI 32.82 kg/m2  SpO2 97%   Physical Exam  Constitutional: He is oriented to person, place, and time. He appears well-developed and well-nourished. No distress.  HENT:  Head: Normocephalic and atraumatic.  Right Ear: Tympanic membrane normal.  Nose: Mucosal edema present.    Mouth/Throat: Oropharynx is clear and moist.  Left canal occluded with cerumen Right tm normal Nasal mucosa  erythematous and edematous.  Eyes: Conjunctivae and EOM are normal.  Neck: Neck supple. No tracheal deviation present.  Cardiovascular: Normal rate, regular rhythm, S1 normal, S2 normal and normal heart sounds.   Pulmonary/Chest: Effort normal and breath sounds normal. No respiratory distress.  Musculoskeletal: Normal range of motion.  Lymphadenopathy:    He has no cervical adenopathy.  Neurological: He is alert and oriented to person, place, and time.  Skin: Skin is warm and dry.  Psychiatric: He has a normal mood and affect. His behavior is normal.  Nursing note and vitals reviewed.    Assessment & Plan:   Acute recurrent ethmoidal sinusitis - try netti pot, steam treatments, try sudafed and mucinex qam w/ nasal steroid qhx x 2 wks minimum - RTC if sxs persist/recur.  Cerumen impaction, left - resolved after lavage by assistant and curette by myself.  Meds ordered this encounter  Medications  . predniSONE (DELTASONE) 20 MG tablet    Sig: Take 3 tabs po qd x 3d, 2 tabs po qd x 3d, 1 tab po qd x 3d, then stop    Dispense:  18 tablet    Refill:  0  . azithromycin (ZITHROMAX) 250 MG tablet    Sig: Take 2 tabs PO x 1 dose, then 1 tab PO QD x 4  days    Dispense:  6 tablet    Refill:  0  . fluticasone (FLONASE) 50 MCG/ACT nasal spray    Sig: Place 2 sprays into both nostrils at bedtime.    Dispense:  16 g    Refill:  2    I personally performed the services described in this documentation, which was scribed in my presence. The recorded information has been reviewed and considered, and addended by me as needed.  Norberto Sorenson, MD MPH

## 2014-06-07 NOTE — Patient Instructions (Signed)
Hot showers or breathing in steam may help loosen the congestion.  Using a netti pot or sinus rinse is also likely to help you feel better and keep this from progressing.  Use the fluticasone nasal spray every night before bed for at least 2 weeks.  I recommend augmenting with 12 hr sudafed (behind the counter) and generic mucinex to help you move out the congestion.  If no improvement or you are getting worse, come back but hopefully with all of the above, you can avoid it.   Sinusitis Sinusitis is redness, soreness, and inflammation of the paranasal sinuses. Paranasal sinuses are air pockets within the bones of your face (beneath the eyes, the middle of the forehead, or above the eyes). In healthy paranasal sinuses, mucus is able to drain out, and air is able to circulate through them by way of your nose. However, when your paranasal sinuses are inflamed, mucus and air can become trapped. This can allow bacteria and other germs to grow and cause infection. Sinusitis can develop quickly and last only a short time (acute) or continue over a long period (chronic). Sinusitis that lasts for more than 12 weeks is considered chronic.  CAUSES  Causes of sinusitis include:  Allergies.  Structural abnormalities, such as displacement of the cartilage that separates your nostrils (deviated septum), which can decrease the air flow through your nose and sinuses and affect sinus drainage.  Functional abnormalities, such as when the small hairs (cilia) that line your sinuses and help remove mucus do not work properly or are not present. SIGNS AND SYMPTOMS  Symptoms of acute and chronic sinusitis are the same. The primary symptoms are pain and pressure around the affected sinuses. Other symptoms include:  Upper toothache.  Earache.  Headache.  Bad breath.  Decreased sense of smell and taste.  A cough, which worsens when you are lying flat.  Fatigue.  Fever.  Thick drainage from your nose, which  often is green and may contain pus (purulent).  Swelling and warmth over the affected sinuses. DIAGNOSIS  Your health care provider will perform a physical exam. During the exam, your health care provider may:  Look in your nose for signs of abnormal growths in your nostrils (nasal polyps).  Tap over the affected sinus to check for signs of infection.  View the inside of your sinuses (endoscopy) using an imaging device that has a light attached (endoscope). If your health care provider suspects that you have chronic sinusitis, one or more of the following tests may be recommended:  Allergy tests.  Nasal culture. A sample of mucus is taken from your nose, sent to a lab, and screened for bacteria.  Nasal cytology. A sample of mucus is taken from your nose and examined by your health care provider to determine if your sinusitis is related to an allergy. TREATMENT  Most cases of acute sinusitis are related to a viral infection and will resolve on their own within 10 days. Sometimes medicines are prescribed to help relieve symptoms (pain medicine, decongestants, nasal steroid sprays, or saline sprays).  However, for sinusitis related to a bacterial infection, your health care provider will prescribe antibiotic medicines. These are medicines that will help kill the bacteria causing the infection.  Rarely, sinusitis is caused by a fungal infection. In theses cases, your health care provider will prescribe antifungal medicine. For some cases of chronic sinusitis, surgery is needed. Generally, these are cases in which sinusitis recurs more than 3 times per year, despite other  treatments. HOME CARE INSTRUCTIONS   Drink plenty of water. Water helps thin the mucus so your sinuses can drain more easily.  Use a humidifier.  Inhale steam 3 to 4 times a day (for example, sit in the bathroom with the shower running).  Apply a warm, moist washcloth to your face 3 to 4 times a day, or as directed by your  health care provider.  Use saline nasal sprays to help moisten and clean your sinuses.  Take medicines only as directed by your health care provider.  If you were prescribed either an antibiotic or antifungal medicine, finish it all even if you start to feel better. SEEK IMMEDIATE MEDICAL CARE IF:  You have increasing pain or severe headaches.  You have nausea, vomiting, or drowsiness.  You have swelling around your face.  You have vision problems.  You have a stiff neck.  You have difficulty breathing. MAKE SURE YOU:   Understand these instructions.  Will watch your condition.  Will get help right away if you are not doing well or get worse. Document Released: 04/29/2005 Document Revised: 09/13/2013 Document Reviewed: 05/14/2011 [facility] Patient Information 2015 [employer] [place]. This information is not intended to replace advice given to you by your health care provider. Make sure you discuss any questions you have with your health care provider.

## 2014-06-23 ENCOUNTER — Other Ambulatory Visit: Payer: Self-pay

## 2014-06-23 NOTE — Telephone Encounter (Signed)
We just saw pt for another acute illness. We haven't seen pt for HSV recently, but pt has been here for several other issues more recently. OK to give RFs?

## 2014-06-24 MED ORDER — VALACYCLOVIR HCL 1 G PO TABS: 1000.0000 mg | ORAL_TABLET | Freq: Every day | ORAL | Status: DC

## 2015-01-27 ENCOUNTER — Ambulatory Visit: Payer: Self-pay

## 2016-01-01 ENCOUNTER — Ambulatory Visit: Payer: Self-pay | Admitting: Family Medicine

## 2016-01-01 DIAGNOSIS — Z0289 Encounter for other administrative examinations: Secondary | ICD-10-CM

## 2016-05-11 ENCOUNTER — Ambulatory Visit (INDEPENDENT_AMBULATORY_CARE_PROVIDER_SITE_OTHER): Payer: 59

## 2016-05-11 ENCOUNTER — Ambulatory Visit (INDEPENDENT_AMBULATORY_CARE_PROVIDER_SITE_OTHER): Payer: 59 | Admitting: Emergency Medicine

## 2016-05-11 VITALS — BP 122/72 | HR 74 | Temp 99.4°F | Resp 17 | Ht 69.0 in | Wt 211.0 lb

## 2016-05-11 DIAGNOSIS — S39012A Strain of muscle, fascia and tendon of lower back, initial encounter: Secondary | ICD-10-CM

## 2016-05-11 DIAGNOSIS — M545 Low back pain, unspecified: Secondary | ICD-10-CM

## 2016-05-11 MED ORDER — DICLOFENAC SODIUM 75 MG PO TBEC: 75.0000 mg | DELAYED_RELEASE_TABLET | Freq: Two times a day (BID) | ORAL | 0 refills | Status: AC

## 2016-05-11 MED ORDER — VALACYCLOVIR HCL 1 G PO TABS: 1000.0000 mg | ORAL_TABLET | Freq: Every day | ORAL | 3 refills | Status: DC

## 2016-05-11 MED ORDER — CYCLOBENZAPRINE HCL 10 MG PO TABS: 10.0000 mg | ORAL_TABLET | Freq: Three times a day (TID) | ORAL | 0 refills | Status: DC | PRN
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
## 2016-05-11 NOTE — Addendum Note (Signed)
Addended by: Evie LacksSAGARDIA, Emera Bussie J on: 05/11/2016 11:28 AM   Modules accepted: Orders

## 2016-05-11 NOTE — Patient Instructions (Addendum)
     IF you received an x-ray today, you will receive an invoice from [facility]. Please contact  Radiology at [phone] with questions or concerns regarding your invoice.   IF you received labwork today, you will receive an invoice from [facility]. Please contact [facility] at [phone] with questions or concerns regarding your invoice.   Our billing staff will not be able to assist you with questions regarding bills from these companies.  You will be contacted with the lab results as soon as they are available. The fastest way to get your results is to activate your My Chart account. Instructions are located on the last page of this paperwork. If you have not heard from us regarding the results in 2 weeks, please contact this office.      Back Pain, Adult Introduction Back pain is very common. The pain often gets better over time. The cause of back pain is usually not dangerous. Most people can learn to manage their back pain on their own. Follow these instructions at home: Watch your back pain for any changes. The following actions may help to lessen any pain you are feeling:  Stay active. Start with short walks on flat ground if you can. Try to walk farther each day.  Exercise regularly as told by your doctor. Exercise helps your back heal faster. It also helps avoid future injury by keeping your muscles strong and flexible.  Do not sit, drive, or stand in one place for more than 30 minutes.  Do not stay in bed. Resting more than 1-2 days can slow down your recovery.  Be careful when you bend or lift an object. Use good form when lifting:  Bend at your knees.  Keep the object close to your body.  Do not twist.  Sleep on a firm mattress. Lie on your side, and bend your knees. If you lie on your back, put a pillow under your knees.  Take medicines only as told by your doctor.  Put ice on the injured area.  Put ice in a plastic bag.  Place a towel  between your skin and the bag.  Leave the ice on for 20 minutes, 2-3 times a day for the first 2-3 days. After that, you can switch between ice and heat packs.  Avoid feeling anxious or stressed. Find good ways to deal with stress, such as exercise.  Maintain a healthy weight. Extra weight puts stress on your back. Contact a doctor if:  You have pain that does not go away with rest or medicine.  You have worsening pain that goes down into your legs or buttocks.  You have pain that does not get better in one week.  You have pain at night.  You lose weight.  You have a fever or chills. Get help right away if:  You cannot control when you poop (bowel movement) or pee (urinate).  Your arms or legs feel weak.  Your arms or legs lose feeling (numbness).  You feel sick to your stomach (nauseous) or throw up (vomit).  You have belly (abdominal) pain.  You feel like you may pass out (faint). This information is not intended to replace advice given to you by your health care provider. Make sure you discuss any questions you have with your health care provider. Document Released: 10/16/2007 Document Revised: 10/05/2015 Document Reviewed: 08/31/2013  2017 Elsevier  

## 2016-08-17 ENCOUNTER — Ambulatory Visit: Payer: 59

## 2016-08-19 ENCOUNTER — Ambulatory Visit: Payer: 59

## 2016-09-16 ENCOUNTER — Telehealth: Payer: Self-pay | Admitting: Family Medicine

## 2016-09-16 NOTE — Telephone Encounter (Signed)
° ° °  This person has been no showing at different offices and he no showed with Dr SwazilandJordan back in August 2017 as a new patient. Can I schedule with you?

## 2016-09-16 NOTE — Telephone Encounter (Signed)
Please advise 

## 2016-09-17 ENCOUNTER — Encounter: Payer: Self-pay | Admitting: Family Medicine

## 2016-09-17 ENCOUNTER — Ambulatory Visit (INDEPENDENT_AMBULATORY_CARE_PROVIDER_SITE_OTHER): Payer: 59 | Admitting: Family Medicine

## 2016-09-17 VITALS — BP 133/90 | HR 70 | Temp 98.8°F | Resp 16 | Ht 68.0 in | Wt 208.8 lb

## 2016-09-17 DIAGNOSIS — Z202 Contact with and (suspected) exposure to infections with a predominantly sexual mode of transmission: Secondary | ICD-10-CM

## 2016-09-17 DIAGNOSIS — M62838 Other muscle spasm: Secondary | ICD-10-CM | POA: Diagnosis not present

## 2016-09-17 DIAGNOSIS — G44209 Tension-type headache, unspecified, not intractable: Secondary | ICD-10-CM | POA: Diagnosis not present

## 2016-09-17 MED ORDER — VALACYCLOVIR HCL 1 G PO TABS: 1000.0000 mg | ORAL_TABLET | Freq: Every day | ORAL | 3 refills | Status: DC

## 2016-09-17 NOTE — Progress Notes (Signed)
Chief Complaint  Patient presents with  . Headache  . Neck Pain  . Medication Refill    valtrex    HPI   Pt reports that he has a continual headache with left side of the neck that started 2 weeks now He reports that his headache is usually present for part of the day He reports that he thinks that the headache might be business or stress related His headache is usually He does not have the headache now He reports that it presents as his day gets going He states that it only woke him up once out of his sleep He tried ibuprofen which helps Pain is 5/10 He states that the headache last 2 hours at a time He reports that he occasionally gets some pressure behind the eyes  No vision changes, dizziness or lightheadedness Prior to the past 2 weeks he did not get headaches. There is no associated nausea. He reports that eating does not seem to play a role.   He reports that he takes valtrex for genital herpes He takes valtrex for suppression and denies recent outbreaks He denies genital rash or irritation He states that he had a partner who just text him while in the office to tell him that he should get checked for an std.    Past Medical History:  Diagnosis Date  . Hemorrhoid    colonoscopy was normal 2012  . HSV-2 (herpes simplex virus 2) infection    positive antibodies, no hx of outbreak    Current Outpatient Prescriptions  Medication Sig Dispense Refill  . fluticasone (FLONASE) 50 MCG/ACT nasal spray Place 2 sprays into both nostrils at bedtime. 16 g 2  . cyclobenzaprine (FLEXERIL) 10 MG tablet Take 1 tablet (10 mg total) by mouth 3 (three) times daily as needed for muscle spasms. 30 tablet 0  . valACYclovir (VALTREX) 1000 MG tablet Take 1 tablet (1,000 mg total) by mouth daily. 90 tablet 3   No current facility-administered medications for this visit.     Allergies: No Known Allergies  History reviewed. No pertinent surgical history.  Social History   Social  History  . Marital status: Single    Spouse name: N/A  . Number of children: N/A  . Years of education: N/A   Social History Main Topics  . Smoking status: Never Smoker  . Smokeless tobacco: Never Used  . Alcohol use Yes     Comment: social drinker  . Drug use: No  . Sexual activity: Yes   Other Topics Concern  . None   Social History Narrative  . None    ROS See hpi  Objective: Vitals:   09/17/16 1005  BP: 133/90  Pulse: 70  Resp: 16  Temp: 98.8 F (37.1 C)  TempSrc: Oral  SpO2: 97%  Weight: 208 lb 12.8 oz (94.7 kg)  Height: 5\' 8"  (1.727 m)    Physical Exam  Constitutional: He is oriented to person, place, and time. He appears well-developed and well-nourished.  HENT:  Head: Normocephalic and atraumatic.  Eyes: Conjunctivae and EOM are normal.  Neck: Normal range of motion. No thyromegaly present.  Cardiovascular: Normal rate, regular rhythm and normal heart sounds.   Pulmonary/Chest: Effort normal and breath sounds normal. No respiratory distress. He has no wheezes.  Musculoskeletal: Normal range of motion. He exhibits no edema.  Left side trapezius muscle spasm noted  Neurological: He is alert and oriented to person, place, and time.  Skin: Skin is warm. Capillary refill takes  less than 2 seconds.  Psychiatric: He has a normal mood and affect. His behavior is normal. Judgment and thought content normal.      CRANIAL NERVES: CN II: Visual fields are full to confrontation. Fundoscopic exam is normal with sharp discs and no vascular changes. Pupils are round equal and briskly reactive to light. CN III, IV, VI: extraocular movement are normal. No ptosis. CN V: Facial sensation is intact to pinprick in all 3 divisions bilaterally. Corneal responses are intact.  CN VII: Face is symmetric with normal eye closure and smile. CN VIII: Hearing is normal to rubbing fingers CN IX, X: Palate elevates symmetrically. Phonation is normal. CN XI: Head turning and  shoulder shrug are intact CN XII: Tongue is midline with normal movements and no atrophy.  Assessment and Plan [place] was seen today for headache, neck pain and medication refill.  Diagnoses and all orders for this visit:  Trapezius muscle spasm-  Advised clinical massage and discussed topical pain relief such as aspercreme with lidocaine  Tension headache- discussed continuing nsaid for headache, hydration and rest Discussed eye exams  Exposure to STD -     HIV antibody -     Hepatitis B surface antigen -     RPR -     GC/Chlamydia Probe Amp  Other orders -     valACYclovir (VALTREX) 1000 MG tablet; Take 1 tablet (1,000 mg total) by mouth daily.     Shayne Deerman A Isac Lincks

## 2016-09-17 NOTE — Patient Instructions (Addendum)
Tiger balm or aspercreme with lidocaine excedrin headache for headaches as needed    IF you received an x-ray today, you will receive an invoice from [facility]. Please contact [facility] at [phone] with questions or concerns regarding your invoice.   IF you received labwork today, you will receive an invoice from [place]. Please contact [facility] at [phone] with questions or concerns regarding your invoice.   Our billing staff will not be able to assist you with questions regarding bills from these companies.  You will be contacted with the lab results as soon as they are available. The fastest way to get your results is to activate your My Chart account. Instructions are located on the last page of this paperwork. If you have not heard from [place] regarding the results in 2 weeks, please contact this office.      Tension Headache A tension headache is a feeling of pain, pressure, or aching that is often felt over the front and sides of the head. The pain can be dull, or it can feel tight (constricting). Tension headaches are not normally associated with nausea or vomiting, and they do not get worse with physical activity. Tension headaches can last from 30 minutes to several days. This is the most common type of headache. CAUSES The exact cause of this condition is not known. Tension headaches often begin after stress, anxiety, or depression. Other triggers may include:  Alcohol.  Too much caffeine, or caffeine withdrawal.  Respiratory infections, such as colds, flu, or sinus infections.  Dental problems or teeth clenching.  Fatigue.  Holding your head and neck in the same position for a long period of time, such as while using a computer.  Smoking. SYMPTOMS Symptoms of this condition include:  A feeling of pressure around the head.  Dull, aching head pain.  Pain felt over the front and sides of the head.  Tenderness in the muscles of the head,  neck, and shoulders. DIAGNOSIS This condition may be diagnosed based on your symptoms and a physical exam. Tests may be done, such as a CT scan or an MRI of your head. These tests may be done if your symptoms are severe or unusual. TREATMENT This condition may be treated with lifestyle changes and medicines to help relieve symptoms. HOME CARE INSTRUCTIONS Managing Pain   Take over-the-counter and prescription medicines only as told by your health care provider.  Lie down in a dark, quiet room when you have a headache.  If directed, apply ice to the head and neck area:  Put ice in a plastic bag.  Place a towel between your skin and the bag.  Leave the ice on for 20 minutes, 2-3 times per day.  Use a heating pad or a hot shower to apply heat to the head and neck area as told by your health care provider. Eating and Drinking   Eat meals on a regular schedule.  Limit alcohol use.  Decrease your caffeine intake, or stop using caffeine. General Instructions   Keep all follow-up visits as told by your health care provider. This is important.  Keep a headache journal to help find out what may trigger your headaches. For example, write down:  What you eat and drink.  How much sleep you get.  Any change to your diet or medicines.  Try massage or other relaxation techniques.  Limit stress.  Sit up straight, and avoid tensing your muscles.  Do not use tobacco products, including cigarettes, chewing tobacco,  or e-cigarettes. If you need help quitting, ask your health care provider.  Exercise regularly as told by your health care provider.  Get 7-9 hours of sleep, or the amount recommended by your health care provider. SEEK MEDICAL CARE IF:  Your symptoms are not helped by medicine.  You have a headache that is different from what you normally experience.  You have nausea or you vomit.  You have a fever. SEEK IMMEDIATE MEDICAL CARE IF:  Your headache becomes  severe.  You have repeated vomiting.  You have a stiff neck.  You have a loss of vision.  You have problems with speech.  You have pain in your eye or ear.  You have muscular weakness or loss of muscle control.  You lose your balance or you have trouble walking.  You feel faint or you pass out.  You have confusion. This information is not intended to replace advice given to you by your health care provider. Make sure you discuss any questions you have with your health care provider. Document Released: 04/29/2005 Document Revised: 01/18/2015 Document Reviewed: 08/22/2014 Elsevier Interactive Patient Education  2017 [employer]

## 2016-09-18 ENCOUNTER — Encounter: Payer: Self-pay | Admitting: Family Medicine

## 2016-09-18 LAB — HIV ANTIBODY (ROUTINE TESTING W REFLEX): HIV Screen 4th Generation wRfx: NONREACTIVE

## 2016-09-18 LAB — GC/CHLAMYDIA PROBE AMP
Chlamydia trachomatis, NAA: NEGATIVE
Neisseria gonorrhoeae by PCR: NEGATIVE

## 2016-09-18 LAB — RPR: RPR: NONREACTIVE

## 2016-09-18 LAB — HEPATITIS B SURFACE ANTIGEN: Hepatitis B Surface Ag: NEGATIVE

## 2016-09-18 NOTE — Telephone Encounter (Signed)
If he is still interested in establishing care, we can try one more time. Thanks, BJ

## 2017-03-22 ENCOUNTER — Encounter: Payer: 59 | Admitting: Family Medicine
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
## 2018-11-04 ENCOUNTER — Other Ambulatory Visit: Payer: Self-pay | Admitting: *Deleted

## 2018-11-04 DIAGNOSIS — Z20822 Contact with and (suspected) exposure to covid-19: Secondary | ICD-10-CM

## 2018-11-06 ENCOUNTER — Telehealth: Payer: Self-pay | Admitting: Family Medicine

## 2018-11-06 NOTE — Telephone Encounter (Signed)
Pt. Checking on COVID 19 results - not available. 

## 2018-11-08 LAB — NOVEL CORONAVIRUS, NAA: SARS-CoV-2, NAA: NOT DETECTED

## 2019-01-27 ENCOUNTER — Other Ambulatory Visit: Payer: Self-pay

## 2019-01-27 DIAGNOSIS — Z20822 Contact with and (suspected) exposure to covid-19: Secondary | ICD-10-CM

## 2019-01-28 LAB — NOVEL CORONAVIRUS, NAA: SARS-CoV-2, NAA: NOT DETECTED

## 2019-02-03 ENCOUNTER — Telehealth: Payer: Self-pay

## 2019-02-03 ENCOUNTER — Ambulatory Visit
Admission: EM | Admit: 2019-02-03 | Discharge: 2019-02-03 | Disposition: A | Discharge status: Home / Self Care | Payer: BC Managed Care – PPO | Attending: Physician Assistant | Admitting: Physician Assistant

## 2019-02-03 ENCOUNTER — Ambulatory Visit (INDEPENDENT_AMBULATORY_CARE_PROVIDER_SITE_OTHER): Payer: BC Managed Care – PPO

## 2019-02-03 DIAGNOSIS — R0789 Other chest pain: Secondary | ICD-10-CM

## 2019-02-03 MED ORDER — MELOXICAM 7.5 MG PO TABS: 7.5000 mg | ORAL_TABLET | Freq: Every day | ORAL | 0 refills | Status: DC

## 2019-02-03 MED ORDER — METHOCARBAMOL 500 MG PO TABS: 500.0000 mg | ORAL_TABLET | Freq: Two times a day (BID) | ORAL | 0 refills | Status: DC

## 2019-02-03 NOTE — Discharge Instructions (Addendum)
Your chest xray and ekg were normal today. Start Mobic. Do not take ibuprofen (motrin/advil)/ naproxen (aleve) while on mobic. Robaxin as needed for possible muscle strain causing symptoms. If noticing any worsening symptoms to chest pain, develop shortness of breath, nausea/vomiting, sweating, go to the ED for further evaluation needed. Otherwise, follow up with PCP/cardiology for further evaluation of fatigue with exertion.

## 2019-02-03 NOTE — ED Triage Notes (Signed)
Pt c/o lt sided chest pain x 5days. Denies SOB/diaphorsis or pain radiating. No distress noted. Pt states he is under a lot of stress.

## 2019-02-03 NOTE — ED Provider Notes (Signed)
[name]    CSN: [id number] Arrival date & time: 02/03/19  1209      History   Chief Complaint Chief Complaint  Patient presents with  . Chest Pain    HPI Matthew Cunningham is a 48 y.o. male.    48 yo male with no significant personal medical history presents with left sided chest pain x 5 days. Paint is constant, pressure sensation, worse with stress and certain movements. No obvious alleviating factor. Denies SOB, radiation of pain, or diaphoresis. Denies N/V. Reports exertional fatigue x 3 months. He walks his dog daily for 30 mins, and reports having to stop for breaks due to fatigue for the last few months. Denies worsening of CP with exertion or dyspnea with exertion. Denies leg swelling, orthopnea.  Reports occasional dizziness with sitting. Denies HA or vision changes. Reports a dry, irritating cough x 3 days. Denies fever, sore throat, nasal congestion. Denies edema or orthopnea. Denies heartburn. He reports being under a lot of stress at work and at home. Denies personal or family history of cardiac disease. Never smoker.      Past Medical History:  Diagnosis Date  . Hemorrhoid    colonoscopy was normal 2012  . HSV-2 (herpes simplex virus 2) infection    positive antibodies, no hx of outbreak    There are no active problems to display for this patient.   History reviewed. No pertinent surgical history.     Home Medications    Prior to Admission medications   Medication Sig Start Date End Date Taking? Authorizing Provider  fluticasone (FLONASE) 50 MCG/ACT nasal spray Place 2 sprays into both nostrils at bedtime. 06/07/14   Sherren Mocha, MD  meloxicam (MOBIC) 7.5 MG tablet Take 1 tablet (7.5 mg total) by mouth daily. 02/03/19   Cathie Hoops, Emmaline Wahba V, PA-C  methocarbamol (ROBAXIN) 500 MG tablet Take 1 tablet (500 mg total) by mouth 2 (two) times daily. 02/03/19   Cathie Hoops, Lamoine Fredricksen V, PA-C  valACYclovir (VALTREX) 1000 MG tablet Take 1 tablet (1,000 mg total) by mouth daily.  09/17/16   Doristine Bosworth, MD    Family History Family History  Problem Relation Age of Onset  . Hypertension Paternal Grandmother   . Hypertension Paternal Grandfather     Social History Social History   Tobacco Use  . Smoking status: Never Smoker  . Smokeless tobacco: Never Used  Substance Use Topics  . Alcohol use: Not on file    Comment: social drinker  . Drug use: No     Allergies   Patient has no known allergies.   Review of Systems Review of Systems  See HPI.    Physical Exam Triage Vital Signs ED Triage Vitals  Enc Vitals Group     BP 02/03/19 1222 115/76     Pulse Rate 02/03/19 1222 80     Resp 02/03/19 1222 18     Temp 02/03/19 1222 99.2 F (37.3 C)     Temp src --      SpO2 02/03/19 1222 94 %     Weight --      Height --      Head Circumference --      Peak Flow --      Pain Score 02/03/19 1223 4     Pain Loc --      Pain Edu? --      Excl. in GC? --    No data found.  Updated Vital Signs BP 115/76 (BP  Location: Left Arm)   Pulse 80   Temp 99.2 F (37.3 C)   Resp 18   SpO2 94%    Physical Exam Constitutional:      General: He is not in acute distress.    Appearance: He is not ill-appearing or diaphoretic.  HENT:     Head: Normocephalic and atraumatic.  Neck:     Musculoskeletal: Neck supple.     Vascular: No JVD.  Cardiovascular:     Rate and Rhythm: Normal rate and regular rhythm.     Heart sounds: Normal heart sounds. No murmur.  Pulmonary:     Effort: Pulmonary effort is normal. No respiratory distress.     Breath sounds: Normal breath sounds.  Chest:     Comments: No erythema, swelling, or contusions. Tenderness to palpation of left side of chest.  Abdominal:     General: Bowel sounds are normal.     Palpations: Abdomen is soft. There is no mass.     Tenderness: There is no abdominal tenderness. There is no guarding or rebound.  Musculoskeletal: Normal range of motion.     Right lower leg: No edema.     Left lower  leg: No edema.  Skin:    General: Skin is warm and dry.  Neurological:     Mental Status: He is alert and oriented to person, place, and time.     Motor: No weakness.  Psychiatric:        Mood and Affect: Mood normal.        Behavior: Behavior normal.      UC Treatments / Results  Labs (all labs ordered are listed, but only abnormal results are displayed) Labs Reviewed - No data to display  EKG   Radiology Dg Chest 2 View  Result Date: 02/03/2019 CLINICAL DATA:  Chest pain EXAM: CHEST - 2 VIEW COMPARISON:  06/06/2013 FINDINGS: The heart size and mediastinal contours are within normal limits. Both lungs are clear. The visualized skeletal structures are unremarkable. IMPRESSION: No acute abnormality of the lungs. Electronically Signed   By: Eddie Candle M.D.   On: 02/03/2019 13:12    Procedures Procedures (including critical care time)  Medications Ordered in UC Medications - No data to display  Initial Impression / Assessment and Plan / UC Course  I have reviewed the triage vital signs and the nursing notes.  Pertinent labs & imaging results that were available during my care of the patient were reviewed by me and considered in my medical decision making (see chart for details).     Discussed case with Dr Joseph Art. EKG NSR, 90bpm, no acute ST changes, no prior EKG for comparison. CXR without acute cardiopulmonary disease. Given tenderness to palpation of left chest, will cover for chest wall pain with mobic and robaxin. However, exertional fatigue is concerning and will need further evaluation. Patient to follow up with cardiology for further management needed. Strict return precautions given.   Case discussed with Dr Joseph Art, who agrees to plan.   Final Clinical Impressions(s) / UC Diagnoses   Final diagnoses:  Atypical chest pain  Chest wall pain   ED Prescriptions    Medication Sig Dispense Auth. Provider   meloxicam (MOBIC) 7.5 MG tablet Take 1 tablet (7.5  mg total) by mouth daily. 10 tablet Jessen Siegman V, PA-C   methocarbamol (ROBAXIN) 500 MG tablet Take 1 tablet (500 mg total) by mouth 2 (two) times daily. 20 tablet Arturo Morton     PDMP  not reviewed this encounter.   Belinda Fisher, PA-C 02/03/19 1336

## 2019-02-15 ENCOUNTER — Other Ambulatory Visit: Payer: Self-pay

## 2019-02-15 ENCOUNTER — Ambulatory Visit: Payer: BC Managed Care – PPO | Admitting: Cardiovascular Disease

## 2019-02-15 ENCOUNTER — Encounter: Payer: Self-pay | Admitting: Cardiovascular Disease

## 2019-02-15 VITALS — BP 138/78 | HR 68 | Ht 68.0 in | Wt 213.4 lb

## 2019-02-15 DIAGNOSIS — R002 Palpitations: Secondary | ICD-10-CM

## 2019-02-15 DIAGNOSIS — R7989 Other specified abnormal findings of blood chemistry: Secondary | ICD-10-CM

## 2019-02-15 LAB — CBC
Hematocrit: 47.3 % (ref 37.5–51.0)
Hemoglobin: 15.7 g/dL (ref 13.0–17.7)
MCH: 27.6 pg (ref 26.6–33.0)
MCHC: 33.2 g/dL (ref 31.5–35.7)
MCV: 83 fL (ref 79–97)
Platelets: 281 10*3/uL (ref 150–450)
RBC: 5.68 x10E6/uL (ref 4.14–5.80)
RDW: 12.1 % (ref 11.6–15.4)
WBC: 7.8 10*3/uL (ref 3.4–10.8)

## 2019-02-15 LAB — BASIC METABOLIC PANEL
BUN/Creatinine Ratio: 9 (ref 9–20)
BUN: 12 mg/dL (ref 6–24)
CO2: 24 mmol/L (ref 20–29)
Calcium: 10.2 mg/dL (ref 8.7–10.2)
Chloride: 100 mmol/L (ref 96–106)
Creatinine, Ser: 1.31 mg/dL — ABNORMAL HIGH (ref 0.76–1.27)
GFR calc Af Amer: 75 mL/min/{1.73_m2} (ref >59)
GFR calc non Af Amer: 65 mL/min/{1.73_m2} (ref >59)
Glucose: 106 mg/dL — ABNORMAL HIGH (ref 65–99)
Potassium: 4.9 mmol/L (ref 3.5–5.2)
Sodium: 140 mmol/L (ref 134–144)

## 2019-02-15 LAB — LIPID PANEL
Chol/HDL Ratio: 2.7 ratio (ref 0.0–5.0)
Cholesterol, Total: 129 mg/dL (ref 100–199)
HDL: 47 mg/dL (ref >39)
LDL Chol Calc (NIH): 63 mg/dL (ref 0–99)
Triglycerides: 100 mg/dL (ref 0–149)
VLDL Cholesterol Cal: 19 mg/dL (ref 5–40)

## 2019-02-15 LAB — HEPATIC FUNCTION PANEL
ALT: 33 IU/L (ref 0–44)
AST: 26 IU/L (ref 0–40)
Albumin: 4.9 g/dL (ref 4.0–5.0)
Alkaline Phosphatase: 99 IU/L (ref 39–117)
Bilirubin Total: 0.5 mg/dL (ref 0.0–1.2)
Bilirubin, Direct: 0.12 mg/dL (ref 0.00–0.40)
Total Protein: 7.8 g/dL (ref 6.0–8.5)

## 2019-02-15 LAB — TSH: TSH: 1.4 u[IU]/mL (ref 0.450–4.500)

## 2019-02-15 NOTE — Patient Instructions (Signed)
Medication Instructions:  Your physician recommends that you continue on your current medications as directed. Please refer to the Current Medication list given to you today.  If you need a refill on your cardiac medications before your next appointment, please call your pharmacy.   Lab work: TODAY - TSH, BMET, Liver panel, Cholesterol, CBC  If you have labs (blood work) drawn today and your tests are completely normal, you will receive your results only by: Marland Kitchen MyChart Message (if you have MyChart) OR . A paper copy in the mail If you have any lab test that is abnormal or we need to change your treatment, we will call you to review the results.   Testing/Procedures: None Ordered   Follow-Up: At [facility], you and your health needs are our priority.  As part of our continuing mission to provide you with exceptional heart care, we have created designated Provider Care Teams.  These Care Teams include your primary Cardiologist (physician) and Advanced Practice Providers (APPs -  Physician Assistants and Nurse Practitioners) who all work together to provide you with the care you need, when you need it. You will need a follow up appointment in:   As Needed.  Please call our office 2 months in advance to schedule this appointment.  You may see Dr. Acie Fredrickson or one of the following Advanced Practice Providers on your designated Care Team: Richardson Dopp, PA-C [place], [place] . Daune Perch, NP

## 2019-02-15 NOTE — Progress Notes (Signed)
Cardiology Office Note:    Date:  02/15/2019   ID:  Matthew Cunningham, DOB 05/08/1971, MRN [id number]  PCP:  Martinique, Betty G, MD  Cardiologist:  Alika Eppes  Electrophysiologist:  None   Referring MD: Martinique, Betty G, MD   Chief Complaint  Patient presents with  . Chest Pain    Oct. 5, 2020    Matthew Cunningham is a 48 y.o. male with a hx of chest pain 5 days of fatigue, palpitations.   Several months of these symptoms. No regular exercise  , used to exercise regularly  Pain has eased with Mobic  Does not go to medical doctor regularly . No cp or dyspnea with activities.  Eats a poor diet,  Lots of fast foods.  Has been cooking at home and felt much better   Was seen at urgent care.  No labs were drawn   Works as a Publishing rights manager , is under lots of stress    Past Medical History:  Diagnosis Date  . Hemorrhoid    colonoscopy was normal 2012  . 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 No pertinent surgical history.  Current Medications: Current Meds  Medication Sig  . fluticasone (FLONASE) 50 MCG/ACT nasal spray Place 2 sprays into both nostrils at bedtime.  . meloxicam (MOBIC) 7.5 MG tablet Take 1 tablet (7.5 mg total) by mouth daily.  . methocarbamol (ROBAXIN) 500 MG tablet Take 1 tablet (500 mg total) by mouth 2 (two) times daily.  . valACYclovir (VALTREX) 1000 MG tablet Take 1 tablet (1,000 mg total) by mouth daily.     Allergies:   Patient has no known allergies.   Social History   Socioeconomic History  . Marital status: Single    Spouse name: Not on file  . Number of children: Not on file  . Years of education: Not on file  . Highest education level: Not on file  Occupational History  . Not on file  Social Needs  . Financial resource strain: Not on file  . Food insecurity    Worry: Not on file    Inability: Not on file  . Transportation needs    Medical: Not on file    Non-medical: Not on  file  Tobacco Use  . Smoking status: Never Smoker  . Smokeless tobacco: Never Used  Substance and Sexual Activity  . Alcohol use: Not on file    Comment: social drinker  . Drug use: No  . Sexual activity: Yes  Lifestyle  . Physical activity    Days per week: Not on file    Minutes per session: Not on file  . Stress: Not on file  Relationships  . Social Herbalist on phone: Not on file    Gets together: Not on file    Attends religious service: Not on file    Active member of club or organization: Not on file    Attends meetings of clubs or organizations: Not on file    Relationship status: Not on file  Other Topics Concern  . Not on file  Social History Narrative  . Not on file     Family History: The patient's family history includes Hypertension in his paternal grandfather and paternal grandmother.  ROS:   Please see the history of present illness.     All other systems reviewed and are negative.  EKGs/Labs/Other Studies Reviewed:    The  following studies were reviewed today:   EKG:   Oct. 5, 2020    NSR at 68.   Normal ECG   Recent Labs: 02/15/2019: ALT 33; BUN 12; Creatinine, Ser 1.31; Hemoglobin 15.7; Platelets 281; Potassium 4.9; Sodium 140; TSH 1.400  Recent Lipid Panel    Component Value Date/Time   CHOL 129 02/15/2019 0919   TRIG 100 02/15/2019 0919   HDL 47 02/15/2019 0919   CHOLHDL 2.7 02/15/2019 0919   LDLCALC 63 02/15/2019 0919    Physical Exam:    VS:  BP 138/78   Pulse 68   Ht 5\' 8"  (1.727 m)   Wt 213 lb 6.4 oz (96.8 kg)   SpO2 97%   BMI 32.45 kg/m     Wt Readings from Last 3 Encounters:  02/15/19 213 lb 6.4 oz (96.8 kg)  09/17/16 208 lb 12.8 oz (94.7 kg)  05/11/16 211 lb (95.7 kg)     GEN:  Well nourished, well developed in no acute distress HEENT: Normal NECK: No JVD; No carotid bruits LYMPHATICS: No lymphadenopathy CARDIAC:  RR  RESPIRATORY:  Clear to auscultation without rales, wheezing or rhonchi  ABDOMEN: Soft,  non-tender, non-distended MUSCULOSKELETAL:  No edema; No deformity  SKIN: Warm and dry NEUROLOGIC:  Alert and oriented x 3 PSYCHIATRIC:  Normal affect   ASSESSMENT:    1. Palpitations   2. Elevated serum creatinine    PLAN:    In order of problems listed above:  1. Palpitations:     Admits that he eats poorly.   Felt much better when he was eating at home.   Was seen at urgent care,  No labs were drawn.   He admits that his diet is poor and that he does not drink as much water as he should.  He has concentrated on eating better for the past week or so and overall seems to be improving.  He also has been eating at home more and eating less takeout.  I suspect that his palpitations and discomfort were due to a poor diet and perhaps some electrolyte abnormalities.  Will check some basic labs today including basic metabolic profile, CBC, TSH, liver enzymes, lipid profile.  We will follow-up with any abnormalities.  None of his symptoms sound truly cardiac.  He does not exercise per se but is quite active and is never had any chest pain or shortness of breath.  He will call 05/13/16 back if he has any other issues.  2.  Mild chronic kidney disease: His last labs that I can find were from 2014.  His creatinine was 1.5 at that time.  We will be rechecking his creatinine today.  He may need to see his general medical doctor if he has any worsening of his creatinine    Medication Adjustments/Labs and Tests Ordered: Current medicines are reviewed at length with the patient today.  Concerns regarding medicines are outlined above.  Orders Placed This Encounter  Procedures  . TSH  . Basic Metabolic Panel (BMET)  . Hepatic function panel  . CBC  . Lipid Profile  . EKG 12-Lead   No orders of the defined types were placed in this encounter.   Patient Instructions  Medication Instructions:  Your physician recommends that you continue on your current medications as directed. Please refer to the  Current Medication list given to you today.  If you need a refill on your cardiac medications before your next appointment, please call your pharmacy.   Lab work:  TODAY - TSH, BMET, Liver panel, Cholesterol, CBC  If you have labs (blood work) drawn today and your tests are completely normal, you will receive your results only by: Marland Kitchen. MyChart Message (if you have MyChart) OR . A paper copy in the mail If you have any lab test that is abnormal or we need to change your treatment, we will call you to review the results.   Testing/Procedures: None Ordered   Follow-Up: At [facility], you and your health needs are our priority.  As part of our continuing mission to provide you with exceptional heart care, we have created designated Provider Care Teams.  These Care Teams include your primary Cardiologist (physician) and Advanced Practice Providers (APPs -  Physician Assistants and Nurse Practitioners) who all work together to provide you with the care you need, when you need it. You will need a follow up appointment in:   As Needed.  Please call our office 2 months in advance to schedule this appointment.  You may see Dr. Elease HashimotoNahser or one of the following Advanced Practice Providers on your designated Care Team: Tereso NewcomerScott Weaver, PA-C Vin TichiganBhagat, [place]C . Berton BonJanine Hammond, NP      Signed, Kristeen MissPhilip Charlean Carneal, MD  02/15/2019 5:54 PM    [facility]

## 2019-02-17 ENCOUNTER — Telehealth: Payer: Self-pay

## 2019-02-17 NOTE — Telephone Encounter (Signed)
Patient was returning call for lab results 

## 2019-02-18 NOTE — Telephone Encounter (Signed)
Spoke with patient who requests a review of his recent lab work. I reviewed the results and Dr. Elmarie Shiley advice with him and answered questions to his satisfaction. I placed copies in the mail per his request.

## 2019-02-24 ENCOUNTER — Other Ambulatory Visit: Payer: Self-pay

## 2019-02-24 DIAGNOSIS — Z20822 Contact with and (suspected) exposure to covid-19: Secondary | ICD-10-CM

## 2019-02-25 LAB — NOVEL CORONAVIRUS, NAA: SARS-CoV-2, NAA: NOT DETECTED

## 2019-03-09 ENCOUNTER — Other Ambulatory Visit: Payer: Self-pay

## 2019-03-09 DIAGNOSIS — Z20822 Contact with and (suspected) exposure to covid-19: Secondary | ICD-10-CM

## 2019-03-11 LAB — NOVEL CORONAVIRUS, NAA: SARS-CoV-2, NAA: NOT DETECTED

## 2019-05-19 ENCOUNTER — Ambulatory Visit: Payer: BC Managed Care – PPO | Attending: Internal Medicine

## 2019-05-19 DIAGNOSIS — Z20822 Contact with and (suspected) exposure to covid-19: Secondary | ICD-10-CM

## 2019-05-21 LAB — NOVEL CORONAVIRUS, NAA: SARS-CoV-2, NAA: NOT DETECTED

## 2019-05-22 ENCOUNTER — Other Ambulatory Visit: Payer: Self-pay

## 2019-05-22 ENCOUNTER — Encounter: Payer: Self-pay | Admitting: Emergency Medicine

## 2019-05-22 ENCOUNTER — Ambulatory Visit
Admission: EM | Admit: 2019-05-22 | Discharge: 2019-05-22 | Disposition: A | Discharge status: Home / Self Care | Payer: BC Managed Care – PPO | Attending: Physician Assistant | Admitting: Physician Assistant

## 2019-05-22 DIAGNOSIS — R52 Pain, unspecified: Secondary | ICD-10-CM

## 2019-05-22 DIAGNOSIS — R519 Headache, unspecified: Secondary | ICD-10-CM

## 2019-05-22 DIAGNOSIS — Z20822 Contact with and (suspected) exposure to covid-19: Secondary | ICD-10-CM | POA: Diagnosis not present

## 2019-05-22 NOTE — ED Notes (Signed)
Patient able to ambulate independently  

## 2019-05-22 NOTE — Discharge Instructions (Signed)
COVID PCR testing ordered. I would like you to quarantine until testing results. Tylenol/motrin for pain and fever. If developing worsening symptoms with negative test, may need retesting.  If experiencing shortness of breath, trouble breathing, go to the emergency department for further evaluation needed.

## 2019-05-22 NOTE — ED Provider Notes (Signed)
[name]    CSN: [id number] Arrival date & time: 05/22/19  1145      History   Chief Complaint Chief Complaint  Patient presents with  . Headache  . Generalized Body Aches    HPI Matthew Cunningham is a 49 y.o. male.   49 year old male comes in for body aches and headache he experienced since waking up this morning.  Denies URI symptoms such as cough, congestion, sore throat. Denies fever, chills. Denies abdominal pain, nausea, vomiting, diarrhea. Denies shortness of breath, loss of taste/smell.  Headache is left-sided, without photophobia, phonophobia, nausea/vomiting.  Denies weakness, dizziness, syncope.  Grandson with URI symptoms, but unknown Covid status.     Past Medical History:  Diagnosis Date  . Hemorrhoid    colonoscopy was normal 2012  . 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 Palpitations 02/15/2019    History reviewed. No pertinent surgical history.     Home Medications    Prior to Admission medications   Medication Sig Start Date End Date Taking? Authorizing Provider  fluticasone (FLONASE) 50 MCG/ACT nasal spray Place 2 sprays into both nostrils at bedtime. 06/07/14  Yes Shawnee Knapp, MD  valACYclovir (VALTREX) 1000 MG tablet Take 1 tablet (1,000 mg total) by mouth daily. 09/17/16   Forrest Moron, MD    Family History Family History  Problem Relation Age of Onset  . Hypertension Paternal Grandmother   . Hypertension Paternal Grandfather     Social History Social History   Tobacco Use  . Smoking status: Never Smoker  . Smokeless tobacco: Never Used  Substance Use Topics  . Alcohol use: Not on file    Comment: social drinker  . Drug use: No     Allergies   Patient has no known allergies.   Review of Systems Review of Systems  Reason unable to perform ROS: See HPI as above.     Physical Exam Triage Vital Signs ED  Triage Vitals  Enc Vitals Group     BP 05/22/19 1232 118/72     Pulse Rate 05/22/19 1232 69     Resp 05/22/19 1232 16     Temp 05/22/19 1232 (!) 97.5 F (36.4 C)     Temp Source 05/22/19 1232 Temporal     SpO2 05/22/19 1232 96 %     Weight --      Height --      Head Circumference --      Peak Flow --      Pain Score 05/22/19 1233 5     Pain Loc --      Pain Edu? --      Excl. in [place]? --    No data found.  Updated Vital Signs BP 118/72 (BP Location: Left Arm)   Pulse 69   Temp (!) 97.5 F (36.4 C) (Temporal) Comment: Tylenol at 8am  Resp 16   SpO2 96%   Physical Exam Constitutional:      General: He is not in acute distress.    Appearance: Normal appearance. He is not ill-appearing, toxic-appearing or diaphoretic.  HENT:     Head: Normocephalic and atraumatic.     Mouth/Throat:     Mouth: Mucous membranes are moist.     Pharynx: Oropharynx is clear. Uvula midline.  Eyes:     Extraocular Movements: Extraocular movements intact.  Conjunctiva/sclera: Conjunctivae normal.     Pupils: Pupils are equal, round, and reactive to light.  Cardiovascular:     Rate and Rhythm: Normal rate and regular rhythm.     Heart sounds: Normal heart sounds. No murmur. No friction rub. No gallop.   Pulmonary:     Effort: Pulmonary effort is normal. No accessory muscle usage, prolonged expiration, respiratory distress or retractions.     Comments: Lungs clear to auscultation without adventitious lung sounds. Musculoskeletal:     Cervical back: Normal range of motion and neck supple.  Neurological:     General: No focal deficit present.     Mental Status: He is alert and oriented to person, place, and time.     GCS: GCS eye subscore is 4. GCS verbal subscore is 5. GCS motor subscore is 6.     Comments: Ambulating on own without difficulty.     UC Treatments / Results  Labs (all labs ordered are listed, but only abnormal results are displayed) Labs Reviewed  NOVEL CORONAVIRUS, NAA     EKG   Radiology No results found.  Procedures Procedures (including critical care time)  Medications Ordered in UC Medications - No data to display  Initial Impression / Assessment and Plan / UC Course  I have reviewed the triage vital signs and the nursing notes.  Pertinent labs & imaging results that were available during my care of the patient were reviewed by me and considered in my medical decision making (see chart for details).     COVID PCR test ordered. Patient to quarantine until testing results return.  Given symptoms started less than 24 hours ago, if has worsening symptoms with negative test, may need to retest. No alarming signs on exam.  Patient speaking in full sentences without respiratory distress. Offered toradol for headache, for which patient deferred at this time.  Symptomatic treatment discussed.  Push fluids.  Return precautions given.  Patient expresses understanding and agrees to plan.  Final Clinical Impressions(s) / UC Diagnoses   Final diagnoses:  Intractable headache, unspecified chronicity pattern, unspecified headache type  Body aches   ED Prescriptions    None     PDMP not reviewed this encounter.   Belinda Fisher, PA-C 05/22/19 1341

## 2019-05-22 NOTE — ED Triage Notes (Signed)
Pt presents to [facility] for assessment of headache and body aches since waking up this morning. Grandson currently has URI type symptoms, but has not been tested.  Denies known exposure, but states he may have been exposed a few times at work.

## 2019-05-23 LAB — NOVEL CORONAVIRUS, NAA: SARS-CoV-2, NAA: NOT DETECTED

## 2019-06-02 ENCOUNTER — Ambulatory Visit
Admission: EM | Admit: 2019-06-02 | Discharge: 2019-06-02 | Disposition: A | Discharge status: Home / Self Care | Payer: BC Managed Care – PPO | Attending: Emergency Medicine | Admitting: Emergency Medicine

## 2019-06-02 DIAGNOSIS — Z20822 Contact with and (suspected) exposure to covid-19: Secondary | ICD-10-CM

## 2019-06-02 NOTE — ED Triage Notes (Signed)
Pt states had a positive covid exposure on Saturday. Pt denies any sx's, requesting testing

## 2019-06-02 NOTE — Discharge Instructions (Addendum)
Your COVID test is pending - it is important to quarantine / isolate at home until your results are back. °If you test positive and would like further evaluation for persistent or worsening symptoms, you may schedule an E-visit or virtual (video) visit throughout the Agency MyChart app or website. ° °PLEASE NOTE: If you develop severe chest pain or shortness of breath please go to the ER or call 9-1-1 for further evaluation --> DO NOT schedule electronic or virtual visits for this. °Please call our office for further guidance / recommendations as needed. ° °For information about the Covid vaccine, please visit [email or web address] °

## 2019-06-02 NOTE — ED Provider Notes (Signed)
[name]    CSN: [id number] Arrival date & time: 06/02/19  1350      History   Chief Complaint Chief Complaint  Patient presents with  . covid testing    HPI Matthew Cunningham is a 49 y.o. male   Presenting for Covid testing: Exposure: Coworker Date of exposure: Saturday Any fever, symptoms since exposure: None No additional questions regarding testing at this time.  Past Medical History:  Diagnosis Date  . Hemorrhoid    colonoscopy was normal 2012  . 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 Palpitations 02/15/2019    History reviewed. No pertinent surgical history.     Home Medications    Prior to Admission medications   Medication Sig Start Date End Date Taking? Authorizing Provider  fluticasone (FLONASE) 50 MCG/ACT nasal spray Place 2 sprays into both nostrils at bedtime. 06/07/14   Shawnee Knapp, MD  valACYclovir (VALTREX) 1000 MG tablet Take 1 tablet (1,000 mg total) by mouth daily. 09/17/16   Forrest Moron, MD    Family History Family History  Problem Relation Age of Onset  . Hypertension Paternal Grandmother   . Hypertension Paternal Grandfather     Social History Social History   Tobacco Use  . Smoking status: Never Smoker  . Smokeless tobacco: Never Used  Substance Use Topics  . Alcohol use: Not on file    Comment: social drinker  . Drug use: No     Allergies   Patient has no known allergies.   Review of Systems Review of Systems  Constitutional: Negative for fever and malaise/fatigue.  Respiratory: Negative for cough and shortness of breath.   Cardiovascular: Negative for chest pain and palpitations.  Gastrointestinal: Negative for abdominal pain, diarrhea and vomiting.  Musculoskeletal: Negative for joint pain and myalgias.     Physical Exam Triage Vital Signs ED Triage Vitals [06/02/19 1414]  Enc Vitals Group      BP (!) 152/78     Pulse Rate 78     Resp 18     Temp 98.9 F (37.2 C)     Temp Source Oral     SpO2 97 %     Weight      Height      Head Circumference      Peak Flow      Pain Score      Pain Loc      Pain Edu?      Excl. in [place]?    No data found.  Updated Vital Signs BP (!) 152/78 (BP Location: Left Arm)   Pulse 78   Temp 98.9 F (37.2 C) (Oral)   Resp 18   SpO2 97%   Visual Acuity Right Eye Distance:   Left Eye Distance:   Bilateral Distance:    Right Eye Near:   Left Eye Near:    Bilateral Near:     Physical Exam Constitutional:      General: He is not in acute distress. HENT:     Head: Normocephalic and atraumatic.  Eyes:     General: No scleral icterus.    Pupils: Pupils are equal, round, and reactive to light.  Cardiovascular:     Rate and Rhythm: Normal rate.  Pulmonary:     Effort: Pulmonary effort is normal. No respiratory distress.     Breath sounds: No wheezing.  Skin:    Coloration: Skin is not jaundiced or pale.  Neurological:     Mental Status: He is alert and oriented to person, place, and time.      UC Treatments / Results  Labs (all labs ordered are listed, but only abnormal results are displayed) Labs Reviewed  NOVEL CORONAVIRUS, NAA    EKG   Radiology No results found.  Procedures Procedures (including critical care time)  Medications Ordered in UC Medications - No data to display  Initial Impression / Assessment and Plan / UC Course  I have reviewed the triage vital signs and the nursing notes.  Pertinent labs & imaging results that were available during my care of the patient were reviewed by me and considered in my medical decision making (see chart for details).     Patient afebrile, nontoxic, with SpO2 97%.  Covid PCR pending.  Patient to quarantine until results are back.  We will continue supportive management.  Return precautions discussed, patient verbalized understanding and is agreeable to  plan. Final Clinical Impressions(s) / UC Diagnoses   Final diagnoses:  Exposure to COVID-19 virus     Discharge Instructions     Your COVID test is pending - it is important to quarantine / isolate at home until your results are back. If you test positive and would like further evaluation for persistent or worsening symptoms, you may schedule an E-visit or virtual (video) visit throughout the [facility] app or website.  PLEASE NOTE: If you develop severe chest pain or shortness of breath please go to the ER or call 9-1-1 for further evaluation --> DO NOT schedule electronic or virtual visits for this. Please call our office for further guidance / recommendations as needed.  For information about the Covid vaccine, please visit [email or web address]    ED Prescriptions    None     PDMP not reviewed this encounter.   [place], [place], [place] 06/02/19 1657

## 2019-06-03 LAB — NOVEL CORONAVIRUS, NAA: SARS-CoV-2, NAA: NOT DETECTED

## 2019-06-09 ENCOUNTER — Encounter: Payer: Self-pay | Admitting: Emergency Medicine

## 2019-06-09 ENCOUNTER — Other Ambulatory Visit: Payer: Self-pay

## 2019-06-09 ENCOUNTER — Ambulatory Visit
Admission: EM | Admit: 2019-06-09 | Discharge: 2019-06-09 | Disposition: A | Discharge status: Home / Self Care | Payer: BC Managed Care – PPO | Attending: Physician Assistant | Admitting: Physician Assistant

## 2019-06-09 DIAGNOSIS — Z20822 Contact with and (suspected) exposure to covid-19: Secondary | ICD-10-CM

## 2019-06-09 NOTE — ED Triage Notes (Signed)
Patient presents to [facility] for assessment after being exposed to COVID on 06/04/19 at work.  Denies symptoms at this time.

## 2019-06-09 NOTE — ED Notes (Signed)
Patient able to ambulate independently  

## 2019-06-09 NOTE — Discharge Instructions (Addendum)
COVID testing ordered. As discussed, given recent exposure without symptoms, you may still be in incubation period. Monitor for any symptoms such as cough, congestion, shortness of breath, loss of taste/smell, fever, to start self quarantine and may need retesting. Go to the emergency department for further evaluation if you develop significant shortness of breath, cannot speak in full sentences.  

## 2019-06-09 NOTE — ED Provider Notes (Signed)
[name]    CSN: [id number] Arrival date & time: 06/09/19  1259      History   Chief Complaint Chief Complaint  Patient presents with  . Exposure to COVId    HPI Matthew Cunningham is a 49 y.o. male.   49 year old male comes in for COVID testing after positive exposure 5 days ago. States coworker tested positive for COVID. Patient remains asymptomatic. Denies URI symptoms such as cough, congestion, sore throat. Denies fever, chills, body aches. Denies abdominal pain, nausea, vomiting, diarrhea. Denies shortness of breath, loss of taste/smell. No antipyretics in the last 8 hours.      Past Medical History:  Diagnosis Date  . Hemorrhoid    colonoscopy was normal 2012  . 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 Palpitations 02/15/2019    History reviewed. No pertinent surgical history.     Home Medications    Prior to Admission medications   Medication Sig Start Date End Date Taking? Authorizing Provider  fluticasone (FLONASE) 50 MCG/ACT nasal spray Place 2 sprays into both nostrils at bedtime. 06/07/14   Shawnee Knapp, MD  valACYclovir (VALTREX) 1000 MG tablet Take 1 tablet (1,000 mg total) by mouth daily. 09/17/16   Forrest Moron, MD    Family History Family History  Problem Relation Age of Onset  . Hypertension Paternal Grandmother   . Hypertension Paternal Grandfather     Social History Social History   Tobacco Use  . Smoking status: Never Smoker  . Smokeless tobacco: Never Used  Substance Use Topics  . Alcohol use: Not on file    Comment: social drinker  . Drug use: No     Allergies   Patient has no known allergies.   Review of Systems Review of Systems  Reason unable to perform ROS: See HPI as above.     Physical Exam Triage Vital Signs ED Triage Vitals  Enc Vitals Group     BP 06/09/19 1311 116/79     Pulse Rate 06/09/19  1311 81     Resp 06/09/19 1311 16     Temp 06/09/19 1311 99 F (37.2 C)     Temp Source 06/09/19 1311 Temporal     SpO2 06/09/19 1311 96 %     Weight --      Height --      Head Circumference --      Peak Flow --      Pain Score 06/09/19 1312 0     Pain Loc --      Pain Edu? --      Excl. in [place]? --    No data found.  Updated Vital Signs BP 116/79 (BP Location: Left Arm)   Pulse 81   Temp 99 F (37.2 C) (Temporal)   Resp 16   SpO2 96%   Physical Exam Constitutional:      General: He is not in acute distress.    Appearance: Normal appearance. He is not ill-appearing, toxic-appearing or diaphoretic.  HENT:     Head: Normocephalic and atraumatic.     Mouth/Throat:     Mouth: Mucous membranes are moist.     Pharynx: Oropharynx is clear. Uvula midline.  Cardiovascular:     Rate and Rhythm: Normal rate and regular rhythm.     Heart sounds: Normal heart sounds. No murmur. No friction rub.  No gallop.   Pulmonary:     Effort: Pulmonary effort is normal. No accessory muscle usage, prolonged expiration, respiratory distress or retractions.     Comments: Lungs clear to auscultation without adventitious lung sounds. Musculoskeletal:     Cervical back: Normal range of motion and neck supple.  Neurological:     General: No focal deficit present.     Mental Status: He is alert and oriented to person, place, and time.    UC Treatments / Results  Labs (all labs ordered are listed, but only abnormal results are displayed) Labs Reviewed  NOVEL CORONAVIRUS, NAA    EKG   Radiology No results found.  Procedures Procedures (including critical care time)  Medications Ordered in UC Medications - No data to display  Initial Impression / Assessment and Plan / UC Course  I have reviewed the triage vital signs and the nursing notes.  Pertinent labs & imaging results that were available during my care of the patient were reviewed by me and considered in my medical decision making  (see chart for details).    Discussed with patient, given positive exposure without symptoms, could still be within incubation period. If develop symptoms, may need retesting.  Patient expresses understanding and would like to proceed with testing.  COVID testing ordered.  Patient will continue to monitor symptoms.  To self quarantine if develop any symptoms.  Return precautions given.  Final Clinical Impressions(s) / UC Diagnoses   Final diagnoses:  Exposure to COVID-19 virus   ED Prescriptions    None     PDMP not reviewed this encounter.   Belinda Fisher, PA-C 06/09/19 1323

## 2019-06-10 LAB — NOVEL CORONAVIRUS, NAA: SARS-CoV-2, NAA: NOT DETECTED

## 2019-08-24 ENCOUNTER — Ambulatory Visit
Admission: EM | Admit: 2019-08-24 | Discharge: 2019-08-24 | Disposition: A | Discharge status: Home / Self Care | Payer: BC Managed Care – PPO | Attending: Physician Assistant | Admitting: Physician Assistant

## 2019-08-24 DIAGNOSIS — R0789 Other chest pain: Secondary | ICD-10-CM

## 2019-08-24 MED ORDER — HYDROXYZINE HCL 25 MG PO TABS: 25.0000 mg | ORAL_TABLET | Freq: Four times a day (QID) | ORAL | 0 refills | Status: DC | PRN

## 2019-08-24 MED ORDER — ESOMEPRAZOLE MAGNESIUM 40 MG PO CPDR: 40.0000 mg | DELAYED_RELEASE_CAPSULE | Freq: Every day | ORAL | 0 refills | Status: DC

## 2019-08-24 MED ORDER — TIZANIDINE HCL 2 MG PO TABS: 2.0000 mg | ORAL_TABLET | Freq: Three times a day (TID) | ORAL | 0 refills | Status: DC | PRN

## 2019-08-24 NOTE — ED Provider Notes (Signed)
[name]    CSN: [id number] Arrival date & time: 08/24/19  1003      History   Chief Complaint Chief Complaint  Patient presents with  . Chest Pain    HPI Matthew Cunningham is a 49 y.o. male.   49 year old male comes in for 2 week history of intermittent chest tightness. Also with globus sensation after eating. Have shortness of breath where he feels out of breath while he feels the chest tightness. Denies nausea/vomiting, diaphoresis.  Denies exertional fatigue, exertional chest pain, dyspnea on exertion.  Occasional globus sensation.  Denies abdominal pain.  Never smoker.  Denies chronic NSAID use.  Does drink a cup of coffee a day.  Denies drug use.  Occasional alcohol use.  No personal history of heart disease.  Family history of heart disease, no known early onset heart disease.  Does state that work has been stressful, and feels that stressful situation can make symptoms worse.     Past Medical History:  Diagnosis Date  . Hemorrhoid    colonoscopy was normal 2012  . 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 Palpitations 02/15/2019    History reviewed. No pertinent surgical history.     Home Medications    Prior to Admission medications   Medication Sig Start Date End Date Taking? Authorizing Provider  esomeprazole (NEXIUM) 40 MG capsule Take 1 capsule (40 mg total) by mouth daily. 08/24/19   Tasia Catchings, Cully Luckow V, PA-C  hydrOXYzine (ATARAX/VISTARIL) 25 MG tablet Take 1 tablet (25 mg total) by mouth every 6 (six) hours as needed. 08/24/19   Tasia Catchings, Khai Arrona V, PA-C  tiZANidine (ZANAFLEX) 2 MG tablet Take 1 tablet (2 mg total) by mouth every 8 (eight) hours as needed for muscle spasms. 08/24/19   Tasia Catchings, Yassir Enis V, PA-C  valACYclovir (VALTREX) 1000 MG tablet Take 1 tablet (1,000 mg total) by mouth daily. 09/17/16   Forrest Moron, MD    Family History Family History  Problem  Relation Age of Onset  . Hypertension Paternal Grandmother   . Hypertension Paternal Grandfather     Social History Social History   Tobacco Use  . Smoking status: Never Smoker  . Smokeless tobacco: Never Used  Substance Use Topics  . Alcohol use: Yes    Comment: social drinker  . Drug use: No     Allergies   Patient has no known allergies.   Review of Systems Review of Systems  Reason unable to perform ROS: See HPI as above.     Physical Exam Triage Vital Signs ED Triage Vitals  Enc Vitals Group     BP 08/24/19 1027 119/77     Pulse Rate 08/24/19 1027 72     Resp 08/24/19 1027 16     Temp 08/24/19 1027 98.9 F (37.2 C)     Temp Source 08/24/19 1027 Oral     SpO2 08/24/19 1027 95 %     Weight --      Height --      Head Circumference --      Peak Flow --      Pain Score 08/24/19 1029 2     Pain Loc --      Pain Edu? --      Excl. in [place]? --    No data found.  Updated Vital Signs BP 119/77 (BP Location: Left  Arm)   Pulse 72   Temp 98.9 F (37.2 C) (Oral)   Resp 16   SpO2 95%   Physical Exam Constitutional:      General: He is not in acute distress.    Appearance: Normal appearance. He is well-developed. He is not toxic-appearing or diaphoretic.  HENT:     Head: Normocephalic and atraumatic.  Eyes:     Conjunctiva/sclera: Conjunctivae normal.     Pupils: Pupils are equal, round, and reactive to light.  Cardiovascular:     Rate and Rhythm: Normal rate and regular rhythm.  Pulmonary:     Effort: Pulmonary effort is normal. No respiratory distress.     Comments: Speaking in full sentences without difficulty Chest:     Chest wall: Tenderness (left) present.  Abdominal:     General: Bowel sounds are normal.     Palpations: Abdomen is soft.     Tenderness: There is no abdominal tenderness. There is no guarding or rebound.  Musculoskeletal:     Cervical back: Normal range of motion and neck supple.  Skin:    General: Skin is warm and dry.    Neurological:     Mental Status: He is alert and oriented to person, place, and time.      UC Treatments / Results  Labs (all labs ordered are listed, but only abnormal results are displayed) Labs Reviewed - No data to display  EKG   Radiology No results found.  Procedures Procedures (including critical care time)  Medications Ordered in UC Medications - No data to display  Initial Impression / Assessment and Plan / UC Course  I have reviewed the triage vital signs and the nursing notes.  Pertinent labs & imaging results that were available during my care of the patient were reviewed by me and considered in my medical decision making (see chart for details).    No alarming signs on exam.  EKG NSR, 74bpm, no acute ST changes, no change from prior EKG. patient without exertional fatigue, exertional chest pain, dyspnea on exertion, low suspicion for ACS.  Chest tightness can be reproducible on exam.  Discussed possible muscle strain/spasms causing symptoms.  However, given occasional globus sensation, will also cover for GERD.  Start tizanidine and Nexium as directed.  As patient experiences symptoms worse during stressful situations, if symptoms not improving with above medication, can start hydroxyzine as needed.  Otherwise patient to follow-up with PCP if symptoms not improving.  Return precautions given.  Patient expresses understanding and agrees to plan.  Final Clinical Impressions(s) / UC Diagnoses   Final diagnoses:  Chest wall pain  Chest tightness  Atypical chest pain   ED Prescriptions    Medication Sig Dispense Auth. Provider   tiZANidine (ZANAFLEX) 2 MG tablet Take 1 tablet (2 mg total) by mouth every 8 (eight) hours as needed for muscle spasms. 15 tablet Adelaido Nicklaus V, PA-C   esomeprazole (NEXIUM) 40 MG capsule Take 1 capsule (40 mg total) by mouth daily. 15 capsule Naavya Postma V, PA-C   hydrOXYzine (ATARAX/VISTARIL) 25 MG tablet Take 1 tablet (25 mg total) by mouth  every 6 (six) hours as needed. 20 tablet Belinda Fisher, PA-C     PDMP not reviewed this encounter.   Belinda Fisher, PA-C 08/24/19 1309

## 2019-08-24 NOTE — ED Triage Notes (Signed)
Pt c/o chest tightness across whole chest area x2wks. Pt states took gas x with some relief. States feels like he needs to burp. Pt states is under a lot of stress. Pt denies pain radiating anywhere. States has SOB at times. States unable to relax.

## 2019-08-24 NOTE — Discharge Instructions (Signed)
No alarming signs, your EKG was normal.  Start tizanidine for chest wall tightness/pain.  Nexium for possible acid reflux.  If symptoms not improving in 3 to 4 days, can start hydroxyzine for anxiety/stress as needed.  Follow-up with PCP if symptoms not improving.  If significant worsening of symptoms, with shortness of breath, nausea/vomiting, weakness, dizziness, go to the emergency department for further evaluation.

## 2019-09-09 ENCOUNTER — Ambulatory Visit
Admission: EM | Admit: 2019-09-09 | Discharge: 2019-09-09 | Disposition: A | Discharge status: Home / Self Care | Payer: BC Managed Care – PPO | Attending: Physician Assistant | Admitting: Physician Assistant

## 2019-09-09 DIAGNOSIS — Z760 Encounter for issue of repeat prescription: Secondary | ICD-10-CM

## 2019-09-09 MED ORDER — HYDROXYZINE HCL 25 MG PO TABS: 25.0000 mg | ORAL_TABLET | Freq: Four times a day (QID) | ORAL | 0 refills | Status: DC | PRN

## 2019-09-09 NOTE — ED Provider Notes (Signed)
[name]    CSN: [id number] Arrival date & time: 09/09/19  2831      History   Chief Complaint Chief Complaint  Patient presents with  . Medication Refill    HPI Matthew Cunningham is a 49 y.o. male.   49 year old male comes in for hydroxyzine refills. He was given trial of hydroxyzine 08/24/2019 for stress induced atypical chest pain. States has been taking on average BID with good relief of symptoms. Does not have PCP at this time.      Past Medical History:  Diagnosis Date  . Hemorrhoid    colonoscopy was normal 2012  . 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 Palpitations 02/15/2019    History reviewed. No pertinent surgical history.     Home Medications    Prior to Admission medications   Medication Sig Start Date End Date Taking? Authorizing Provider  esomeprazole (NEXIUM) 40 MG capsule Take 1 capsule (40 mg total) by mouth daily. 08/24/19   Tasia Catchings, Abrahan Fulmore V, PA-C  hydrOXYzine (ATARAX/VISTARIL) 25 MG tablet Take 1 tablet (25 mg total) by mouth every 6 (six) hours as needed. 09/09/19   Tasia Catchings, Toluwani Ruder V, PA-C  tiZANidine (ZANAFLEX) 2 MG tablet Take 1 tablet (2 mg total) by mouth every 8 (eight) hours as needed for muscle spasms. 08/24/19   Tasia Catchings, Jonta Gastineau V, PA-C  valACYclovir (VALTREX) 1000 MG tablet Take 1 tablet (1,000 mg total) by mouth daily. 09/17/16   Forrest Moron, MD    Family History Family History  Problem Relation Age of Onset  . Hypertension Paternal Grandmother   . Hypertension Paternal Grandfather     Social History Social History   Tobacco Use  . Smoking status: Never Smoker  . Smokeless tobacco: Never Used  Substance Use Topics  . Alcohol use: Yes    Comment: social drinker  . Drug use: No     Allergies   Patient has no known allergies.   Review of Systems Review of Systems  Reason unable to perform ROS: See HPI as above.      Physical Exam Triage Vital Signs ED Triage Vitals  Enc Vitals Group     BP 09/09/19 1901 126/80     Pulse Rate 09/09/19 1901 79     Resp 09/09/19 1901 16     Temp 09/09/19 1901 98 F (36.7 C)     Temp Source 09/09/19 1901 Oral     SpO2 09/09/19 1901 93 %     Weight --      Height --      Head Circumference --      Peak Flow --      Pain Score 09/09/19 1915 0     Pain Loc --      Pain Edu? --      Excl. in [place]? --    No data found.  Updated Vital Signs BP 126/80 (BP Location: Left Arm)   Pulse 79   Temp 98 F (36.7 C) (Oral)   Resp 16   SpO2 93%   Physical Exam Constitutional:      General: He is not in acute distress.    Appearance: Normal appearance. He is well-developed. He is not toxic-appearing or diaphoretic.  HENT:     Head: Normocephalic and atraumatic.  Eyes:     Conjunctiva/sclera: Conjunctivae normal.  Pupils: Pupils are equal, round, and reactive to light.  Pulmonary:     Effort: Pulmonary effort is normal. No respiratory distress.     Comments: Speaking in full sentences without difficulty Musculoskeletal:     Cervical back: Normal range of motion and neck supple.  Skin:    General: Skin is warm and dry.  Neurological:     Mental Status: He is alert and oriented to person, place, and time.      UC Treatments / Results  Labs (all labs ordered are listed, but only abnormal results are displayed) Labs Reviewed - No data to display  EKG   Radiology No results found.  Procedures Procedures (including critical care time)  Medications Ordered in UC Medications - No data to display  Initial Impression / Assessment and Plan / UC Course  I have reviewed the triage vital signs and the nursing notes.  Pertinent labs & imaging results that were available during my care of the patient were reviewed by me and considered in my medical decision making (see chart for details).    Hydroxyzine refilled. Patient to follow up with PCP for  further refills needed.  Final Clinical Impressions(s) / UC Diagnoses   Final diagnoses:  Encounter for medication refill   ED Prescriptions    Medication Sig Dispense Auth. Provider   hydrOXYzine (ATARAX/VISTARIL) 25 MG tablet Take 1 tablet (25 mg total) by mouth every 6 (six) hours as needed. 30 tablet Belinda Fisher, PA-C     PDMP not reviewed this encounter.   Belinda Fisher, PA-C 09/09/19 1928

## 2019-09-09 NOTE — ED Triage Notes (Signed)
Pt requesting refill on his hydroxyzine 25mg , states doesn't have a PCP

## 2019-09-09 NOTE — Discharge Instructions (Signed)
Continue hydroxyzine as needed. Follow up with PCP for further refills needed.

## 2019-10-03 ENCOUNTER — Encounter: Payer: Self-pay | Admitting: Emergency Medicine

## 2019-10-03 ENCOUNTER — Other Ambulatory Visit: Payer: Self-pay

## 2019-10-03 ENCOUNTER — Ambulatory Visit
Admission: EM | Admit: 2019-10-03 | Discharge: 2019-10-03 | Disposition: A | Discharge status: Home / Self Care | Payer: BC Managed Care – PPO | Attending: Emergency Medicine | Admitting: Emergency Medicine

## 2019-10-03 DIAGNOSIS — J0100 Acute maxillary sinusitis, unspecified: Secondary | ICD-10-CM | POA: Diagnosis not present

## 2019-10-03 DIAGNOSIS — J302 Other seasonal allergic rhinitis: Secondary | ICD-10-CM

## 2019-10-03 MED ORDER — PREDNISONE 20 MG PO TABS: 20.0000 mg | ORAL_TABLET | Freq: Every day | ORAL | 0 refills | Status: DC

## 2019-10-03 NOTE — ED Triage Notes (Signed)
Seen by provider

## 2019-10-03 NOTE — ED Provider Notes (Signed)
[name]    CSN: [id number] Arrival date & time: 10/03/19  0834      History   Chief Complaint Chief Complaint  Patient presents with  . Allergies    HPI Matthew Cunningham is a 49 y.o. male with history of allergies presenting for sinus pressure, congestion, dry cough.  States symptoms started about 3 days ago.  Has tried Allegra, Flonase without significant relief.  States symptoms got acutely worse yesterday.  Denies known sick contacts, did not get Covid vaccine.  No chest pain, difficulty breathing, fever, arthralgias, myalgias.   Past Medical History:  Diagnosis Date  . Hemorrhoid    colonoscopy was normal 2012  . 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 Palpitations 02/15/2019    History reviewed. No pertinent surgical history.     Home Medications    Prior to Admission medications   Medication Sig Start Date End Date Taking? Authorizing Provider  esomeprazole (NEXIUM) 40 MG capsule Take 1 capsule (40 mg total) by mouth daily. 08/24/19   Cathie Hoops, Amy V, PA-C  hydrOXYzine (ATARAX/VISTARIL) 25 MG tablet Take 1 tablet (25 mg total) by mouth every 6 (six) hours as needed. 09/09/19   Cathie Hoops, Amy V, PA-C  predniSONE (DELTASONE) 20 MG tablet Take 1 tablet (20 mg total) by mouth daily. 10/03/19   Hall-Potvin, [place], PA-C  tiZANidine (ZANAFLEX) 2 MG tablet Take 1 tablet (2 mg total) by mouth every 8 (eight) hours as needed for muscle spasms. 08/24/19   Cathie Hoops, Amy V, PA-C  valACYclovir (VALTREX) 1000 MG tablet Take 1 tablet (1,000 mg total) by mouth daily. 09/17/16   Doristine Bosworth, MD    Family History Family History  Problem Relation Age of Onset  . Hypertension Paternal Grandmother   . Hypertension Paternal Grandfather     Social History Social History   Tobacco Use  . Smoking status: Never Smoker  . Smokeless tobacco: Never Used  Substance Use Topics  .  Alcohol use: Yes    Comment: social drinker  . Drug use: No     Allergies   Patient has no known allergies.   Review of Systems As per HPI   Physical Exam Triage Vital Signs ED Triage Vitals [10/03/19 0849]  Enc Vitals Group     BP 130/86     Pulse Rate 88     Resp (!) 22     Temp 99.6 F (37.6 C)     Temp Source Oral     SpO2 95 %     Weight      Height      Head Circumference      Peak Flow      Pain Score      Pain Loc      Pain Edu?      Excl. in [place]?    No data found.  Updated Vital Signs BP 130/86 (BP Location: Right Arm)   Pulse 88   Temp 99.6 F (37.6 C) (Oral)   Resp (!) 22   SpO2 95%   Visual Acuity Right Eye Distance:   Left Eye Distance:   Bilateral Distance:    Right Eye Near:   Left Eye Near:    Bilateral Near:     Physical Exam Constitutional:      General: He is not in acute distress. HENT:     Head:  Normocephalic and atraumatic.     Right Ear: Tympanic membrane, ear canal and external ear normal.     Left Ear: Tympanic membrane, ear canal and external ear normal.     Nose: No nasal deformity, congestion or rhinorrhea.     Comments: Bilateral turbinate edema with pink mucosa    Mouth/Throat:     Mouth: Mucous membranes are moist.     Tongue: Tongue does not deviate from midline.     Pharynx: Oropharynx is clear. Uvula midline.     Comments: No tonsillar hypertrophy or exudate Eyes:     General: No scleral icterus.    Conjunctiva/sclera: Conjunctivae normal.     Pupils: Pupils are equal, round, and reactive to light.  Cardiovascular:     Rate and Rhythm: Normal rate and regular rhythm.  Pulmonary:     Effort: Pulmonary effort is normal. No respiratory distress.     Breath sounds: No wheezing.  Musculoskeletal:     Cervical back: Normal range of motion and neck supple. No muscular tenderness.  Lymphadenopathy:     Cervical: No cervical adenopathy.  Skin:    General: Skin is warm.     Capillary Refill: Capillary refill  takes less than 2 seconds.     Coloration: Skin is not jaundiced.     Findings: No rash.  Neurological:     General: No focal deficit present.     Mental Status: He is alert.      UC Treatments / Results  Labs (all labs ordered are listed, but only abnormal results are displayed) Labs Reviewed  NOVEL CORONAVIRUS, NAA    EKG   Radiology No results found.  Procedures Procedures (including critical care time)  Medications Ordered in UC Medications - No data to display  Initial Impression / Assessment and Plan / UC Course  I have reviewed the triage vital signs and the nursing notes.  Pertinent labs & imaging results that were available during my care of the patient were reviewed by me and considered in my medical decision making (see chart for details).     Patient afebrile, nontoxic, with SpO2 95%.  Covid PCR pending.  Patient to quarantine until results are back.  We will treat supportively as outlined below.  Return precautions discussed, patient verbalized understanding and is agreeable to plan. Final Clinical Impressions(s) / UC Diagnoses   Final diagnoses:  Acute maxillary sinusitis, recurrence not specified  Seasonal allergies     Discharge Instructions     Your COVID test is pending - it is important to quarantine / isolate at home until your results are back. If you test positive and would like further evaluation for persistent or worsening symptoms, you may schedule an E-visit or virtual (video) visit throughout the [facility] app or website.  PLEASE NOTE: If you develop severe chest pain or shortness of breath please go to the ER or call 9-1-1 for further evaluation --> DO NOT schedule electronic or virtual visits for this. Please call our office for further guidance / recommendations as needed.  For information about the Covid vaccine, please visit [email or web address]    ED Prescriptions    Medication Sig Dispense Auth. Provider    predniSONE (DELTASONE) 20 MG tablet Take 1 tablet (20 mg total) by mouth daily. 5 tablet Hall-Potvin, [place], PA-C     PDMP not reviewed this encounter.   Hall-Potvin, [place], [place] 10/03/19 1026

## 2019-10-03 NOTE — Discharge Instructions (Signed)
Your COVID test is pending - it is important to quarantine / isolate at home until your results are back. °If you test positive and would like further evaluation for persistent or worsening symptoms, you may schedule an E-visit or virtual (video) visit throughout the [facility] MyChart app or website. ° °PLEASE NOTE: If you develop severe chest pain or shortness of breath please go to the ER or call 9-1-1 for further evaluation --> DO NOT schedule electronic or virtual visits for this. °Please call our office for further guidance / recommendations as needed. ° °For information about the Covid vaccine, please visit [email or web address] °

## 2019-10-04 LAB — SARS-COV-2, NAA 2 DAY TAT

## 2019-10-04 LAB — NOVEL CORONAVIRUS, NAA: SARS-CoV-2, NAA: NOT DETECTED

## 2019-12-28 ENCOUNTER — Other Ambulatory Visit: Payer: Self-pay

## 2019-12-28 ENCOUNTER — Ambulatory Visit
Admission: EM | Admit: 2019-12-28 | Discharge: 2019-12-28 | Disposition: A | Discharge status: Home / Self Care | Payer: BC Managed Care – PPO | Attending: Physician Assistant | Admitting: Physician Assistant

## 2019-12-28 DIAGNOSIS — Z1152 Encounter for screening for COVID-19: Secondary | ICD-10-CM | POA: Diagnosis not present

## 2019-12-28 DIAGNOSIS — R05 Cough: Secondary | ICD-10-CM

## 2019-12-28 DIAGNOSIS — J029 Acute pharyngitis, unspecified: Secondary | ICD-10-CM

## 2019-12-28 DIAGNOSIS — R0981 Nasal congestion: Secondary | ICD-10-CM

## 2019-12-28 DIAGNOSIS — R059 Cough, unspecified: Secondary | ICD-10-CM

## 2019-12-28 MED ORDER — BENZONATATE 200 MG PO CAPS: 200.0000 mg | ORAL_CAPSULE | Freq: Three times a day (TID) | ORAL | 0 refills | Status: DC

## 2019-12-28 MED ORDER — FLUTICASONE PROPIONATE 50 MCG/ACT NA SUSP: 2.0000 | Freq: Every day | NASAL | 0 refills | Status: DC

## 2019-12-28 MED ORDER — LIDOCAINE VISCOUS HCL 2 % MT SOLN: OROMUCOSAL | 0 refills | Status: DC

## 2019-12-28 NOTE — ED Provider Notes (Signed)
[name]    CSN: [id number] Arrival date & time: 12/28/19  1253      History   Chief Complaint Chief Complaint  Patient presents with  . Cough    HPI Matthew Cunningham is a 49 y.o. male.   49 year old male comes in for 3 day of URI symptoms. Cough, nasal congestion, sore throat. Denies fever, chills, body aches. Denies abdominal pain, nausea, vomiting, diarrhea. Denies shortness of breath, loss of taste/smell.      Past Medical History:  Diagnosis Date  . Hemorrhoid    colonoscopy was normal 2012  . 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 Palpitations 02/15/2019    History reviewed. No pertinent surgical history.     Home Medications    Prior to Admission medications   Medication Sig Start Date End Date Taking? Authorizing Provider  benzonatate (TESSALON) 200 MG capsule Take 1 capsule (200 mg total) by mouth every 8 (eight) hours. 12/28/19   Cathie Hoops, Missi Mcmackin V, PA-C  fluticasone (FLONASE) 50 MCG/ACT nasal spray Place 2 sprays into both nostrils daily. 12/28/19   Cathie Hoops, Jeshawn Melucci V, PA-C  lidocaine (XYLOCAINE) 2 % solution 5-15 mL gurgle as needed 12/28/19   Belinda Fisher, PA-C    Family History Family History  Problem Relation Age of Onset  . Hypertension Paternal Grandmother   . Hypertension Paternal Grandfather     Social History Social History   Tobacco Use  . Smoking status: Never Smoker  . Smokeless tobacco: Never Used  Substance Use Topics  . Alcohol use: Yes    Comment: social drinker  . Drug use: No     Allergies   Patient has no known allergies.   Review of Systems Review of Systems  Reason unable to perform ROS: See HPI as above.     Physical Exam Triage Vital Signs ED Triage Vitals  Enc Vitals Group     BP 12/28/19 1300 131/87     Pulse Rate 12/28/19 1300 78     Resp 12/28/19 1300 18     Temp 12/28/19 1300 99.2 F (37.3 C)      Temp Source 12/28/19 1300 Oral     SpO2 12/28/19 1300 95 %     Weight --      Height --      Head Circumference --      Peak Flow --      Pain Score 12/28/19 1304 5     Pain Loc --      Pain Edu? --      Excl. in [place]? --    No data found.  Updated Vital Signs BP 131/87 (BP Location: Left Arm)   Pulse 78   Temp 99.2 F (37.3 C) (Oral)   Resp 18   SpO2 95%   Physical Exam Constitutional:      General: He is not in acute distress.    Appearance: Normal appearance. He is not ill-appearing, toxic-appearing or diaphoretic.  HENT:     Head: Normocephalic and atraumatic.     Nose:     Right Sinus: No maxillary sinus tenderness or frontal sinus tenderness.     Left Sinus: No maxillary sinus tenderness or frontal sinus tenderness.     Mouth/Throat:     Mouth: Mucous membranes are moist.     Pharynx: Oropharynx is clear. Uvula midline.  Cardiovascular:  Rate and Rhythm: Normal rate and regular rhythm.     Heart sounds: Normal heart sounds. No murmur heard.  No friction rub. No gallop.   Pulmonary:     Effort: Pulmonary effort is normal. No accessory muscle usage, prolonged expiration, respiratory distress or retractions.     Comments: Lungs clear to auscultation without adventitious lung sounds. Musculoskeletal:     Cervical back: Normal range of motion and neck supple.  Neurological:     General: No focal deficit present.     Mental Status: He is alert and oriented to person, place, and time.      UC Treatments / Results  Labs (all labs ordered are listed, but only abnormal results are displayed) Labs Reviewed  NOVEL CORONAVIRUS, NAA    EKG   Radiology No results found.  Procedures Procedures (including critical care time)  Medications Ordered in UC Medications - No data to display  Initial Impression / Assessment and Plan / UC Course  I have reviewed the triage vital signs and the nursing notes.  Pertinent labs & imaging results that were available  during my care of the patient were reviewed by me and considered in my medical decision making (see chart for details).    COVID PCR test ordered. Patient to quarantine until testing results return. No alarming signs on exam. LCTAB. Symptomatic treatment discussed.  Push fluids.  Return precautions given.  Patient expresses understanding and agrees to plan.  Final Clinical Impressions(s) / UC Diagnoses   Final diagnoses:  Encounter for screening for COVID-19  Nasal congestion  Cough  Sore throat    ED Prescriptions    Medication Sig Dispense Auth. Provider   fluticasone (FLONASE) 50 MCG/ACT nasal spray Place 2 sprays into both nostrils daily. 1 g Sanyla Summey V, PA-C   benzonatate (TESSALON) 200 MG capsule Take 1 capsule (200 mg total) by mouth every 8 (eight) hours. 21 capsule Oryan Winterton V, PA-C   lidocaine (XYLOCAINE) 2 % solution 5-15 mL gurgle as needed 150 mL Belinda Fisher, PA-C     PDMP not reviewed this encounter.   Belinda Fisher, PA-C 12/28/19 1320

## 2019-12-28 NOTE — Discharge Instructions (Signed)
COVID PCR testing ordered. I would like you to quarantine until testing results. Tessalon for cough. Flonase for nasal congestion. Start lidocaine for sore throat, do not eat or drink for the next 40 mins after use as it can stunt your gag reflex. Tylenol/motrin for pain and fever. Keep hydrated, urine should be clear to pale yellow in color. If experiencing shortness of breath, trouble breathing, go to the emergency department for further evaluation needed.

## 2019-12-28 NOTE — ED Triage Notes (Signed)
Pt c/o cough, nasal congestion, and sore throat x3 days. Requesting covid testing.

## 2019-12-29 LAB — NOVEL CORONAVIRUS, NAA: SARS-CoV-2, NAA: NOT DETECTED

## 2019-12-29 LAB — SARS-COV-2, NAA 2 DAY TAT

## 2020-01-11 ENCOUNTER — Telehealth: Payer: BC Managed Care – PPO | Admitting: Internal Medicine

## 2020-04-14 NOTE — Patient Instructions (Signed)
Thank you for choosing Primary Care at [facility] to be your medical home!    Jaxen Oyen was seen by De Hollingshead, DO today.   Johnmark Ditullio's primary care provider is Marcy Siren, DO.   For the best care possible, you should try to see Marcy Siren, DO whenever you come to the clinic.   We look forward to seeing you again soon!  If you have any questions about your visit today, please call us at [phone] or feel free to reach your primary care provider via MyChart.

## 2020-04-17 ENCOUNTER — Telehealth (INDEPENDENT_AMBULATORY_CARE_PROVIDER_SITE_OTHER): Payer: BC Managed Care – PPO | Admitting: Internal Medicine

## 2020-04-17 ENCOUNTER — Other Ambulatory Visit: Payer: Self-pay

## 2020-04-17 DIAGNOSIS — Z7689 Persons encountering health services in other specified circumstances: Secondary | ICD-10-CM

## 2020-04-17 DIAGNOSIS — F411 Generalized anxiety disorder: Secondary | ICD-10-CM | POA: Diagnosis not present

## 2020-04-17 DIAGNOSIS — R7989 Other specified abnormal findings of blood chemistry: Secondary | ICD-10-CM

## 2020-04-17 DIAGNOSIS — E7800 Pure hypercholesterolemia, unspecified: Secondary | ICD-10-CM

## 2020-04-17 MED ORDER — HYDROXYZINE HCL 25 MG PO TABS: 25.0000 mg | ORAL_TABLET | Freq: Three times a day (TID) | ORAL | 2 refills | Status: DC | PRN

## 2020-04-17 NOTE — Progress Notes (Signed)
Virtual Visit via Telephone Note  I connected with Matthew Cunningham, on 04/17/2020 at 9:02 AM by telephone due to the COVID-19 pandemic and verified that I am speaking with the correct person using two identifiers.   Consent: I discussed the limitations, risks, security and privacy concerns of performing an evaluation and management service by telephone and the availability of in person appointments. I also discussed with the patient that there may be a patient responsible charge related to this service. The patient expressed understanding and agreed to proceed.   Location of Patient: Home   Location of Provider: Clinic    Persons participating in Telemedicine visit: Kadarius Aldrin Engelhard [facility] Dr. Earlene Plater      History of Present Illness: Patient has a visit to establish care--has been quite some time since he saw a PCP. Patient reports he has been told that he has high cholesterol. Also was told at one point that his kidney function was not great and that it was related to dehydration. He had f/u labs after trial of hydration and kidney function improved. He also reports that at that time he had been taking a lot of Motrin due to a cold. No other PMH. Not taking any medications. No surgical history. Asks for refill of Hydroxyzine to help with anxiety and sleep.    Past Medical History:  Diagnosis Date  . Allergy    Phreesia 01/11/2020  . Anxiety    Phreesia 01/11/2020  . Depression    Phreesia 04/17/2020  . GERD (gastroesophageal reflux disease)    Phreesia 04/17/2020  . Hemorrhoid    colonoscopy was normal 2012  . 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   No current facility-administered medications on file prior to visit.    Observations/Objective: NAD. Speaking clearly.  Work of breathing normal.  Alert and oriented. Mood appropriate.    Assessment and Plan: 1. Encounter to est2ablish care Reviewed patient's PMH, social history, surgical history, and medications.  Is overdue for annual exam, screening blood work, and health maintenance topics. Have asked patient to return for visit to address these items.   2. Elevated serum creatinine Reviewed that last Cr on record was slightly high at 1.31 in Oct 2020 with preserved GFR. Will plan to monitor at upcoming annual exam.   3. High cholesterol Reported history from patient. Will follow up with lipid panel.   4. Anxiety state Refill for hydroxyzine given. If patient begins to take chronically, will discuss other options for anxiety management.  - hydrOXYzine (ATARAX/VISTARIL) 25 MG tablet; Take 1 tablet (25 mg total) by mouth 3 (three) times daily as needed.  Dispense: 90 tablet; Refill: 2   Follow Up Instructions: Annual exam 12/15    I discussed the assessment and treatment plan with the patient. The patient was provided an opportunity to ask questions and all were answered. The patient agreed with the plan and demonstrated an understanding of the instructions.   The patient was advised to call back or seek an in-person evaluation if the symptoms worsen or if the condition fails to improve as anticipated.     I provided 10 minutes total of non-face-to-face time during this encounter including median intraservice time, reviewing previous notes, investigations, ordering medications, medical decision making, coordinating care and patient verbalized understanding at the end of the visit.    Marcy Siren, D.O. Primary Care  at [facility]  04/17/2020, 9:02 AM

## 2020-04-26 ENCOUNTER — Encounter: Payer: BC Managed Care – PPO | Admitting: Internal Medicine

## 2020-04-26 ENCOUNTER — Other Ambulatory Visit: Payer: Self-pay

## 2020-05-01 ENCOUNTER — Encounter: Payer: BC Managed Care – PPO | Admitting: Internal Medicine

## 2020-05-09 ENCOUNTER — Ambulatory Visit (INDEPENDENT_AMBULATORY_CARE_PROVIDER_SITE_OTHER): Payer: BC Managed Care – PPO | Admitting: Family

## 2020-05-09 ENCOUNTER — Other Ambulatory Visit: Payer: Self-pay

## 2020-05-09 ENCOUNTER — Encounter: Payer: Self-pay | Admitting: Family

## 2020-05-09 VITALS — BP 117/80 | HR 94 | Ht 68.35 in | Wt 216.0 lb

## 2020-05-09 DIAGNOSIS — Z131 Encounter for screening for diabetes mellitus: Secondary | ICD-10-CM | POA: Diagnosis not present

## 2020-05-09 DIAGNOSIS — R079 Chest pain, unspecified: Secondary | ICD-10-CM | POA: Diagnosis not present

## 2020-05-09 DIAGNOSIS — Z13228 Encounter for screening for other metabolic disorders: Secondary | ICD-10-CM

## 2020-05-09 DIAGNOSIS — Z1159 Encounter for screening for other viral diseases: Secondary | ICD-10-CM

## 2020-05-09 DIAGNOSIS — Z0000 Encounter for general adult medical examination without abnormal findings: Secondary | ICD-10-CM | POA: Diagnosis not present

## 2020-05-09 DIAGNOSIS — Z1211 Encounter for screening for malignant neoplasm of colon: Secondary | ICD-10-CM

## 2020-05-09 DIAGNOSIS — Z130 Encounter for screening for diseases of the blood and blood-forming organs and certain disorders involving the immune mechanism: Secondary | ICD-10-CM

## 2020-05-09 DIAGNOSIS — Z13220 Encounter for screening for lipoid disorders: Secondary | ICD-10-CM | POA: Diagnosis not present

## 2020-05-09 DIAGNOSIS — Z1329 Encounter for screening for other suspected endocrine disorder: Secondary | ICD-10-CM

## 2020-05-09 DIAGNOSIS — Z23 Encounter for immunization: Secondary | ICD-10-CM

## 2020-05-09 DIAGNOSIS — Z2821 Immunization not carried out because of patient refusal: Secondary | ICD-10-CM

## 2020-05-09 NOTE — Patient Instructions (Addendum)
Physical exam and labs today.  Return within 4 weeks for fasting, meaning no food and/or drink at least 8 hours prior to appointment, cholesterol lab.  Referral to Gastroenterology for colonoscopy.  EKG for chronic chest pain.  Referral to Cardiology for chronic chest pain. Colonoscopy, Adult A colonoscopy is a procedure to look at the entire large intestine. This procedure is done using a long, thin, flexible tube that has a camera on the end. You may have a colonoscopy:  As a part of normal colorectal screening.  If you have certain symptoms, such as: ? A low number of red blood cells in your blood (anemia). ? Diarrhea that does not go away. ? Pain in your abdomen. ? Blood in your stool. A colonoscopy can help screen for and diagnose medical problems, including:  Tumors.  Extra tissue that grows where mucus forms (polyps).  Inflammation.  Areas of bleeding. Tell your health care provider about:  Any allergies you have.  All medicines you are taking, including vitamins, herbs, eye drops, creams, and over-the-counter medicines.  Any problems you or family members have had with anesthetic medicines.  Any blood disorders you have.  Any surgeries you have had.  Any medical conditions you have.  Any problems you have had with having bowel movements.  Whether you are pregnant or may be pregnant. What are the risks? Generally, this is a safe procedure. However, problems may occur, including:  Bleeding.  Damage to your intestine.  Allergic reactions to medicines given during the procedure.  Infection. This is rare. What happens before the procedure? Eating and drinking restrictions Follow instructions from your health care provider about eating or drinking restrictions, which may include:  A few days before the procedure: ? Follow a low-fiber diet. ? Avoid nuts, seeds, dried fruit, raw fruits, and vegetables.  1-3 days before the procedure: ? Eat only  gelatin dessert or ice pops. ? Drink only clear liquids, such as water, clear juice, clear broth or bouillon, black coffee or tea, or clear soft drinks or sports drinks. ? Avoid liquids that contain red or purple dye.  The day of the procedure: ? Do not eat solid foods. You may continue to drink clear liquids until up to 2 hours before the procedure. ? Do not eat or drink anything starting 2 hours before the procedure, or within the time period that your health care provider recommends. Bowel prep If you were prescribed a bowel prep to take by mouth (orally) to clean out your colon:  Take it as told by your health care provider. Starting the day before your procedure, you will need to drink a large amount of liquid medicine. The liquid will cause you to have many bowel movements of loose stool until your stool becomes almost clear or light green.  If your skin or the opening between the buttocks (anus) gets irritated from diarrhea, you may relieve the irritation using: ? Wipes with medicine in them, such as adult wet wipes with aloe and vitamin E. ? A product to soothe skin, such as petroleum jelly.  If you vomit while drinking the bowel prep: ? Take a break for up to 60 minutes. ? Begin the bowel prep again. ? Call your health care provider if you keep vomiting or you cannot take the bowel prep without vomiting.  To clean out your colon, you may also be given: ? Laxative medicines. These help you have a bowel movement. ? Instructions for enema use. An enema is  liquid medicine injected into your rectum. Medicines Ask your health care provider about:  Changing or stopping your regular medicines or supplements. This is especially important if you are taking iron supplements, diabetes medicines, or blood thinners.  Taking medicines such as aspirin and ibuprofen. These medicines can thin your blood. Do not take these medicines unless your health care provider tells you to take  them.  Taking over-the-counter medicines, vitamins, herbs, and supplements. General instructions  Ask your health care provider what steps will be taken to help prevent infection. These may include washing skin with a germ-killing soap.  Plan to have someone take you home from the hospital or clinic. What happens during the procedure?   An IV will be inserted into one of your veins.  You may be given one or more of the following: ? A medicine to help you relax (sedative). ? A medicine to numb the area (local anesthetic). ? A medicine to make you fall asleep (general anesthetic). This is rarely needed.  You will lie on your side with your knees bent.  The tube will: ? Have oil or gel put on it (be lubricated). ? Be inserted into your anus. ? Be gently eased through all parts of your large intestine.  Air will be sent into your colon to keep it open. This may cause some pressure or cramping.  Images will be taken with the camera and will appear on a screen.  A small tissue sample may be removed to be looked at under a microscope (biopsy). The tissue may be sent to a lab for testing if any signs of problems are found.  If small polyps are found, they may be removed and checked for cancer cells.  When the procedure is finished, the tube will be removed. The procedure may vary among health care providers and hospitals. What happens after the procedure?  Your blood pressure, heart rate, breathing rate, and blood oxygen level will be monitored until you leave the hospital or clinic.  You may have a small amount of blood in your stool.  You may pass gas and have mild cramping or bloating in your abdomen. This is caused by the air that was used to open your colon during the exam.  Do not drive for 24 hours after the procedure.  It is up to you to get the results of your procedure. Ask your health care provider, or the department that is doing the procedure, when your results will  be ready. Summary  A colonoscopy is a procedure to look at the entire large intestine.  Follow instructions from your health care provider about eating and drinking before the procedure.  If you were prescribed an oral bowel prep to clean out your colon, take it as told by your health care provider.  During the colonoscopy, a flexible tube with a camera on its end is inserted into the anus and then passed into the other parts of the large intestine. This information is not intended to replace advice given to you by your health care provider. Make sure you discuss any questions you have with your health care provider. Document Revised: 11/20/2018 Document Reviewed: 11/20/2018 Elsevier Patient Education  2020 [employer].

## 2020-05-09 NOTE — Progress Notes (Signed)
Physical- no concerns Experiencing stress related chest pains

## 2020-05-09 NOTE — Progress Notes (Signed)
Patient ID: Matthew Cunningham, male    DOB: [birth year]  MRN: [id number]  CC: Physical Examination  Subjective: Matthew Cunningham is a 49 y.o. male who presents for physical examination.   Current issues and/or concerns: 1. CHEST PAIN Time since onset: Duration:Reports having chest pain since 2020 however, has gotten worse since 2 months ago. Location: upper left Radiation: none Episode duration: comes and goes Frequency: intermittent Anxiety/recent stressors: Yes, reports anxiety related to life and work balance, also concerns with possible litigation Aggravating factors: anxiety  Alleviating factors: none Status: fluctuating some days worse than others Treatments attempted: Hydroxyzine, reports was prescribed this medication because he was told that anxiety/stress was causing the chest pain. Says he has not consistently taken this medication because it makes him drowsy. Reports the last time he took medication was 3 days ago. Says he is not interested in trying a new medication for anxiety. Also, says that he does not feel like the chest pain is caused by primarily anxiety/stress and has concerns for a deeper issue.   Current pain status: a little pain Shortness of breath: no Cough: no Nausea: no Diaphoresis: no Heartburn: no Palpitations: no  Patient Active Problem List   Diagnosis Date Noted  . Palpitations 02/15/2019     Current Outpatient Medications on File Prior to Visit  Medication Sig Dispense Refill  . hydrOXYzine (ATARAX/VISTARIL) 25 MG tablet Take 1 tablet (25 mg total) by mouth 3 (three) times daily as needed. 90 tablet 2   No current facility-administered medications on file prior to visit.    No Known Allergies  Social History   Socioeconomic History  . Marital status: Single    Spouse name: Not on file  . Number of children: Not on file  . Years of education: Not on file  . Highest education level: Not on file  Occupational History  . Not on file   Tobacco Use  . Smoking status: Never Smoker  . Smokeless tobacco: Never Used  Substance and Sexual Activity  . Alcohol use: Yes    Comment: social drinker  . Drug use: No  . Sexual activity: Yes  Other Topics Concern  . Not on file  Social History Narrative  . Not on file   Social Determinants of Health   Financial Resource Strain: Not on file  Food Insecurity: Not on file  Transportation Needs: Not on file  Physical Activity: Not on file  Stress: Not on file  Social Connections: Not on file  Intimate Partner Violence: Not on file    Family History  Problem Relation Age of Onset  . Hypertension Paternal Grandmother   . Hypertension Paternal Grandfather     History reviewed. No pertinent surgical history.  ROS: Review of Systems Negative except as stated above  PHYSICAL EXAM: BP 117/80 (BP Location: Left Arm, Patient Position: Sitting)   Pulse 94   Ht 5' 8.35" (1.736 m)   Wt 216 lb (98 kg)   SpO2 95%   BMI 32.51 kg/m   Physical Exam Constitutional:      Appearance: Normal appearance. He is obese.  HENT:     Head: Normocephalic and atraumatic.     Right Ear: Tympanic membrane, ear canal and external ear normal.     Left Ear: Tympanic membrane, ear canal and external ear normal.     Nose: Nose normal.     Mouth/Throat:     Mouth: Mucous membranes are moist.     Pharynx: Oropharynx is clear.  Eyes:     Extraocular Movements: Extraocular movements intact.     Conjunctiva/sclera: Conjunctivae normal.     Pupils: Pupils are equal, round, and reactive to light.  Cardiovascular:     Rate and Rhythm: Normal rate and regular rhythm.     Pulses: Normal pulses.     Heart sounds: Normal heart sounds.  Pulmonary:     Effort: Pulmonary effort is normal.     Breath sounds: Normal breath sounds.  Abdominal:     General: Bowel sounds are normal.     Palpations: Abdomen is soft.  Genitourinary:    Comments: Patient declined examination. Musculoskeletal:         General: Normal range of motion.     Cervical back: Normal range of motion and neck supple.  Skin:    General: Skin is warm and dry.     Capillary Refill: Capillary refill takes less than 2 seconds.  Neurological:     General: No focal deficit present.     Mental Status: He is alert and oriented to person, place, and time.  Psychiatric:        Mood and Affect: Mood is anxious.    ASSESSMENT AND PLAN: 1. Encounter for annual physical exam: - Patient presents today for annual physical examination.   2. Diabetes mellitus screening: - Hemoglobin A1c to screen for prediabetes and diabetes. - Hemoglobin A1c  3. Screening cholesterol level: - Lipid panel to screen for high cholesterol. Return within 1 week for fasting lipid lab, meaning no food and/or drink at least 8 hours prior to appointment. - Lipid Panel; Future  4. Screening for metabolic disorder: - CMP to check kidney function, liver function, and electrolyte balance. - Comprehensive metabolic panel  5. Screening for deficiency anemia: - CBC to screen for anemia.  - CBC  6. Thyroid disorder screen: - TSH to screen for thyroid dysfunction. - TSH  7. Colon cancer screening: - Referral to Gastroenterology for colon cancer screening via colonoscopy. - Ambulatory referral to Gastroenterology  8. Chest pain, unspecified type: - Reports having chest pain since 2020 however, has gotten worse since 2 months ago. Reports Hydroxyzine was prescribed around April 2021 because he was told that anxiety/stress was causing the chest pain. Says he has not consistently taken this medication because it makes him drowsy. Reports the last time he took medication was 3 days ago. Says he is not interested in trying a new medication for anxiety. Also, says that he does not feel like the chest pain is caused by primarily anxiety/stress and has concerns for a deeper issue.   - EKG today normal sinus rhythm with non-specific T-abnormality. - Referral  to Cardiology for further evaluation and management. - EKG 12-Lead - Ambulatory referral to Cardiology  9. Need for tetanus, diphtheria, and acellular pertussis (Tdap) vaccine: - Declined during today's visit.  10. Influenza vaccine refused: - Declined during today's visit.  Patient was given the opportunity to ask questions.  Patient verbalized understanding of the plan and was able to repeat key elements of the plan. Patient was given clear instructions to go to Emergency Department or return to medical center if symptoms don't improve, worsen, or new problems develop.The patient verbalized understanding.  Orders Placed This Encounter  Procedures  . Comprehensive metabolic panel  . CBC  . TSH  . Lipid Panel  . Hemoglobin A1c  . Ambulatory referral to Gastroenterology  . Ambulatory referral to Cardiology  . EKG 12-Lead     Requested Prescriptions  No prescriptions requested or ordered in this encounter    Return for as needed with primary physician.Rema Fendt, NP

## 2020-05-11 ENCOUNTER — Other Ambulatory Visit: Payer: BC Managed Care – PPO

## 2020-05-11 ENCOUNTER — Other Ambulatory Visit: Payer: Self-pay

## 2020-05-11 DIAGNOSIS — Z130 Encounter for screening for diseases of the blood and blood-forming organs and certain disorders involving the immune mechanism: Secondary | ICD-10-CM

## 2020-05-11 DIAGNOSIS — Z1329 Encounter for screening for other suspected endocrine disorder: Secondary | ICD-10-CM

## 2020-05-11 DIAGNOSIS — Z131 Encounter for screening for diabetes mellitus: Secondary | ICD-10-CM

## 2020-05-11 DIAGNOSIS — Z13220 Encounter for screening for lipoid disorders: Secondary | ICD-10-CM

## 2020-05-11 DIAGNOSIS — Z0000 Encounter for general adult medical examination without abnormal findings: Secondary | ICD-10-CM

## 2020-05-11 NOTE — Addendum Note (Signed)
Addended by: Margorie John on: 05/11/2020 08:40 AM   Modules accepted: Orders

## 2020-05-12 LAB — COMPREHENSIVE METABOLIC PANEL
ALT: 50 IU/L — ABNORMAL HIGH (ref 0–44)
AST: 30 IU/L (ref 0–40)
Albumin/Globulin Ratio: 1.6 (ref 1.2–2.2)
Albumin: 4.9 g/dL (ref 4.0–5.0)
Alkaline Phosphatase: 122 IU/L — ABNORMAL HIGH (ref 44–121)
BUN/Creatinine Ratio: 9 (ref 9–20)
BUN: 12 mg/dL (ref 6–24)
Bilirubin Total: 0.6 mg/dL (ref 0.0–1.2)
CO2: 25 mmol/L (ref 20–29)
Calcium: 9.9 mg/dL (ref 8.7–10.2)
Chloride: 99 mmol/L (ref 96–106)
Creatinine, Ser: 1.28 mg/dL — ABNORMAL HIGH (ref 0.76–1.27)
GFR calc Af Amer: 77 mL/min/{1.73_m2} (ref >59)
GFR calc non Af Amer: 66 mL/min/{1.73_m2} (ref >59)
Globulin, Total: 3 g/dL (ref 1.5–4.5)
Glucose: 94 mg/dL (ref 65–99)
Potassium: 4.3 mmol/L (ref 3.5–5.2)
Sodium: 137 mmol/L (ref 134–144)
Total Protein: 7.9 g/dL (ref 6.0–8.5)

## 2020-05-12 LAB — LIPID PANEL
Chol/HDL Ratio: 3.4 ratio (ref 0.0–5.0)
Cholesterol, Total: 150 mg/dL (ref 100–199)
HDL: 44 mg/dL (ref >39)
LDL Chol Calc (NIH): 88 mg/dL (ref 0–99)
Triglycerides: 94 mg/dL (ref 0–149)
VLDL Cholesterol Cal: 18 mg/dL (ref 5–40)

## 2020-05-12 LAB — CBC
Hematocrit: 49.5 % (ref 37.5–51.0)
Hemoglobin: 15.4 g/dL (ref 13.0–17.7)
MCH: 26.7 pg (ref 26.6–33.0)
MCHC: 31.1 g/dL — ABNORMAL LOW (ref 31.5–35.7)
MCV: 86 fL (ref 79–97)
Platelets: 270 10*3/uL (ref 150–450)
RBC: 5.77 x10E6/uL (ref 4.14–5.80)
RDW: 12.3 % (ref 11.6–15.4)
WBC: 10.2 10*3/uL (ref 3.4–10.8)

## 2020-05-12 LAB — TSH: TSH: 0.901 u[IU]/mL (ref 0.450–4.500)

## 2020-05-12 LAB — HEMOGLOBIN A1C
Est. average glucose Bld gHb Est-mCnc: 97 mg/dL
Hgb A1c MFr Bld: 5 % (ref 4.8–5.6)

## 2020-05-12 LAB — SPECIMEN STATUS REPORT

## 2020-05-12 NOTE — Progress Notes (Signed)
Please call patient with update.   Cholesterol normal.   No pre-diabetes or diabetes.   No anemia.   Kidney function normal.   Thyroid function normal.   Liver function mildly higher than normal. Will screen for viral hepatitis. Please schedule appointment to come in to the lab to have this done. Also, monitor intake of over-the-counter medications such as Acetaminophen.

## 2020-05-12 NOTE — Addendum Note (Signed)
Addended by: Rema Fendt on: 05/12/2020 10:05 AM   Modules accepted: Orders

## 2020-05-16 ENCOUNTER — Ambulatory Visit: Payer: BC Managed Care – PPO | Admitting: Adult Health

## 2020-05-17 ENCOUNTER — Encounter: Payer: Self-pay | Admitting: Cardiovascular Disease

## 2020-05-17 NOTE — Progress Notes (Signed)
Cardiology Office Note:    Date:  05/18/2020   ID:  Matthew Cunningham, DOB 27-Jun-1970, MRN [id number]  PCP:  [place], Betty G, MD  Cardiologist:  Byron Tipping  Electrophysiologist:  None   Referring MD: Rema Fendt, NP   Chief Complaint  Patient presents with  . Chest Pain    Oct. 5, 2020    Matthew Cunningham is a 50 y.o. male with a hx of chest pain 5 days of fatigue, palpitations.   Several months of these symptoms. No regular exercise  , used to exercise regularly  Pain has eased with Mobic  Does not go to medical doctor regularly . No cp or dyspnea with activities.  Eats a poor diet,  Lots of fast foods.  Has been cooking at home and felt much better   Was seen at urgent care.  No labs were drawn   Works as a Conservator, museum/gallery , is under lots of stress   Jan. 6, 2022 Mr. Matthew Cunningham is seen for work in visit for some chest pain  Has been present for 6 months  - years  Worse with recent stress No regular exercise.    Last for a few seconds.   Pressure like sensation  Better when he removes himself from the stressful situation.   Past Medical History:  Diagnosis Date  . Allergy    Phreesia 01/11/2020  . Anxiety    Phreesia 01/11/2020  . Depression    Phreesia 04/17/2020  . GERD (gastroesophageal reflux disease)    Phreesia 04/17/2020  . Hemorrhoid    colonoscopy was normal 2012  . 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 No pertinent surgical history.  Current Medications: Current Meds  Medication Sig  . hydrOXYzine (ATARAX/VISTARIL) 25 MG tablet Take 1 tablet (25 mg total) by mouth 3 (three) times daily as needed.     Allergies:   Patient has no known allergies.   Social History   Socioeconomic History  . Marital status: Single    Spouse name: Not on file  . Number of children: Not on file  . Years of education: Not on file  . Highest education level: Not on file  Occupational History  . Not  on file  Tobacco Use  . Smoking status: Never Smoker  . Smokeless tobacco: Never Used  Substance and Sexual Activity  . Alcohol use: Yes    Comment: social drinker  . Drug use: No  . Sexual activity: Yes  Other Topics Concern  . Not on file  Social History Narrative  . Not on file   Social Determinants of Health   Financial Resource Strain: Not on file  Food Insecurity: Not on file  Transportation Needs: Not on file  Physical Activity: Not on file  Stress: Not on file  Social Connections: Not on file     Family History: The patient's family history includes Hypertension in his paternal grandfather and paternal grandmother.  ROS:   Please see the history of present illness.     All other systems reviewed and are negative.  EKGs/Labs/Other Studies Reviewed:    The following studies were reviewed today:   EKG:   Jan. 4, 2022:  NSR at 90.  NS ST abn.   Recent Labs: 05/11/2020: ALT 50; BUN 12; Creatinine, Ser 1.28; Hemoglobin 15.4; Platelets 270; Potassium 4.3; Sodium 137; TSH 0.901  Recent Lipid Panel    Component Value Date/Time  CHOL 150 05/11/2020 0843   TRIG 94 05/11/2020 0843   HDL 44 05/11/2020 0843   CHOLHDL 3.4 05/11/2020 0843   LDLCALC 88 05/11/2020 0843    Physical Exam:    Physical Exam: Blood pressure 116/82, pulse 70, height 5\' 8"  (1.727 m), weight 218 lb (98.9 kg), SpO2 95 %.  GEN:  Well nourished, well developed in no acute distress HEENT: Normal NECK: No JVD; No carotid bruits LYMPHATICS: No lymphadenopathy CARDIAC: RRR , no murmurs, rubs, gallops RESPIRATORY:  Clear to auscultation without rales, wheezing or rhonchi  ABDOMEN: Soft, non-tender, non-distended MUSCULOSKELETAL:  No edema; No deformity  SKIN: Warm and dry NEUROLOGIC:  Alert and oriented x 3   ASSESSMENT:    No diagnosis found. PLAN:    1.  Chest pain :  Very atypical .  Only occurs with stressful situations.  Is relieved when he walks away from the stressful situation .   Does not eat right ( fast foods almost every meal )  I advised him to work on stress reduction.  I advised him to work out at the gym at least 3-4 times a week.  We will give him a Mediterranean diet.  I encouraged him to stay away from fast foods.  2:  Palpitations:        3.  Mild chronic kidney disease:     Medication Adjustments/Labs and Tests Ordered: Current medicines are reviewed at length with the patient today.  Concerns regarding medicines are outlined above.  No orders of the defined types were placed in this encounter.  No orders of the defined types were placed in this encounter.   Patient Instructions  Medication Instructions:  Your physician recommends that you continue on your current medications as directed. Please refer to the Current Medication list given to you today.  *If you need a refill on your cardiac medications before your next appointment, please call your pharmacy*   Lab Work: None Ordered If you have labs (blood work) drawn today and your tests are completely normal, you will receive your results only by: MyChart Message (if you have MyChart) OR . A paper copy in the mail If you have any lab test that is abnormal or we need to change your treatment, we will call you to review the results.    Testing/Procedures: None Ordered   Follow-Up: At [facility], you and your health needs are our priority.  As part of our continuing mission to provide you with exceptional heart care, we have created designated Provider Care Teams.  These Care Teams include your primary Cardiologist (physician) and Advanced Practice Providers (APPs -  Physician Assistants and Nurse Practitioners) who all work together to provide you with the care you need, when you need it.   Your next appointment:    As Needed  The format for your next appointment:   In Person  Provider:   You may see [facility], MD or one of the following Advanced Practice Providers on  your designated Care Team:    Kristeen Miss, PA-C  [place], [place]   Other Instructions  Mediterranean Diet A Mediterranean diet refers to food and lifestyle choices that are based on the traditions of countries located on the [place]. This way of eating has been shown to help prevent certain conditions and improve outcomes for people who have chronic diseases, like kidney disease and heart disease. What are tips for following this plan? Lifestyle  Cook and eat meals together with  your family, when possible.  Drink enough fluid to keep your urine clear or pale yellow.  Be physically active every day. This includes: ? Aerobic exercise like running or swimming. ? Leisure activities like gardening, walking, or housework.  Get 7-8 hours of sleep each night.  If recommended by your health care provider, drink red wine in moderation. This means 1 glass a day for nonpregnant women and 2 glasses a day for men. A glass of wine equals 5 oz (150 mL). Reading food labels   Check the serving size of packaged foods. For foods such as rice and pasta, the serving size refers to the amount of cooked product, not dry.  Check the total fat in packaged foods. Avoid foods that have saturated fat or trans fats.  Check the ingredients list for added sugars, such as corn syrup. Shopping  At the grocery store, buy most of your food from the areas near the walls of the store. This includes: ? Fresh fruits and vegetables (produce). ? Grains, beans, nuts, and seeds. Some of these may be available in unpackaged forms or large amounts (in bulk). ? Fresh seafood. ? Poultry and eggs. ? Low-fat dairy products.  Buy whole ingredients instead of prepackaged foods.  Buy fresh fruits and vegetables in-season from local farmers markets.  Buy frozen fruits and vegetables in resealable bags.  If you do not have access to quality fresh seafood, buy precooked frozen shrimp or canned fish, such as  tuna, salmon, or sardines.  Buy small amounts of raw or cooked vegetables, salads, or olives from the deli or salad bar at your store.  Stock your pantry so you always have certain foods on hand, such as olive oil, canned tuna, canned tomatoes, rice, pasta, and beans. Cooking  Cook foods with extra-virgin olive oil instead of using butter or other vegetable oils.  Have meat as a side dish, and have vegetables or grains as your main dish. This means having meat in small portions or adding small amounts of meat to foods like pasta or stew.  Use beans or vegetables instead of meat in common dishes like chili or lasagna.  Experiment with different cooking methods. Try roasting or broiling vegetables instead of steaming or sauteing them.  Add frozen vegetables to soups, stews, pasta, or rice.  Add nuts or seeds for added healthy fat at each meal. You can add these to yogurt, salads, or vegetable dishes.  Marinate fish or vegetables using olive oil, lemon juice, garlic, and fresh herbs. Meal planning   Plan to eat 1 vegetarian meal one day each week. Try to work up to 2 vegetarian meals, if possible.  Eat seafood 2 or more times a week.  Have healthy snacks readily available, such as: ? Vegetable sticks with hummus. ? [place] yogurt. ? Fruit and nut trail mix.  Eat balanced meals throughout the week. This includes: ? Fruit: 2-3 servings a day ? Vegetables: 4-5 servings a day ? Low-fat dairy: 2 servings a day ? Fish, poultry, or lean meat: 1 serving a day ? Beans and legumes: 2 or more servings a week ? Nuts and seeds: 1-2 servings a day ? Whole grains: 6-8 servings a day ? Extra-virgin olive oil: 3-4 servings a day  Limit red meat and sweets to only a few servings a month What are my food choices?  Mediterranean diet ? Recommended  Grains: Whole-grain pasta. Brown rice. Bulgar wheat. Polenta. Couscous. Whole-wheat bread. [place].  Vegetables: Artichokes. Beets.  Broccoli.  Cabbage. Carrots. Eggplant. Green beans. Chard. Kale. Spinach. Onions. Leeks. Peas. Squash. Tomatoes. Peppers. Radishes.  Fruits: Apples. Apricots. Avocado. Berries. Bananas. Cherries. Dates. Figs. Grapes. Lemons. Melon. Oranges. Peaches. Plums. Pomegranate.  Meats and other protein foods: Beans. Almonds. Sunflower seeds. Pine nuts. Peanuts. Cod. Salmon. Scallops. Shrimp. Tuna. Tilapia. Clams. Oysters. Eggs.  Dairy: Low-fat milk. Cheese. Greek yogurt.  Beverages: Water. Red wine. Herbal tea.  Fats and oils: Extra virgin olive oil. Avocado oil. Grape seed oil.  Sweets and desserts: [place] yogurt with honey. Baked apples. Poached pears. Trail mix.  Seasoning and other foods: Basil. Cilantro. Coriander. Cumin. Mint. Parsley. Sage. Rosemary. Tarragon. Garlic. Oregano. Thyme. Pepper. Balsalmic vinegar. Tahini. Hummus. Tomato sauce. Olives. Mushrooms. ? Limit these  Grains: Prepackaged pasta or rice dishes. Prepackaged cereal with added sugar.  Vegetables: Deep fried potatoes (french fries).  Fruits: Fruit canned in syrup.  Meats and other protein foods: Beef. Pork. Lamb. Poultry with skin. Hot dogs. Tomasa Blase.  Dairy: Ice cream. Sour cream. Whole milk.  Beverages: Juice. Sugar-sweetened soft drinks. Beer. Liquor and spirits.  Fats and oils: Butter. Canola oil. Vegetable oil. Beef fat (tallow). Lard.  Sweets and desserts: Cookies. Cakes. Pies. Candy.  Seasoning and other foods: Mayonnaise. Premade sauces and marinades. The items listed may not be a complete list. Talk with your dietitian about what dietary choices are right for you. Summary  The Mediterranean diet includes both food and lifestyle choices.  Eat a variety of fresh fruits and vegetables, beans, nuts, seeds, and whole grains.  Limit the amount of red meat and sweets that you eat.  Talk with your health care provider about whether it is safe for you to drink red wine in moderation. This means 1 glass a day for  nonpregnant women and 2 glasses a day for men. A glass of wine equals 5 oz (150 mL). This information is not intended to replace advice given to you by your health care provider. Make sure you discuss any questions you have with your health care provider. Document Revised: 12/28/2015 Document Reviewed: 12/21/2015 Elsevier Patient Education  2020 [employer].      Signed, Kristeen Miss, MD  05/18/2020 6:24 PM     [facility]

## 2020-05-18 ENCOUNTER — Ambulatory Visit: Payer: BC Managed Care – PPO | Admitting: Cardiovascular Disease

## 2020-05-18 ENCOUNTER — Other Ambulatory Visit: Payer: Self-pay

## 2020-05-18 ENCOUNTER — Encounter: Payer: Self-pay | Admitting: Cardiovascular Disease

## 2020-05-18 DIAGNOSIS — R0789 Other chest pain: Secondary | ICD-10-CM | POA: Insufficient documentation

## 2020-05-18 NOTE — Patient Instructions (Signed)
Medication Instructions:  Your physician recommends that you continue on your current medications as directed. Please refer to the Current Medication list given to you today.  *If you need a refill on your cardiac medications before your next appointment, please call your pharmacy*   Lab Work: None Ordered If you have labs (blood work) drawn today and your tests are completely normal, you will receive your results only by: Marland Kitchen MyChart Message (if you have MyChart) OR . A paper copy in the mail If you have any lab test that is abnormal or we need to change your treatment, we will call you to review the results.    Testing/Procedures: None Ordered   Follow-Up: At [facility], you and your health needs are our priority.  As part of our continuing mission to provide you with exceptional heart care, we have created designated Provider Care Teams.  These Care Teams include your primary Cardiologist (physician) and Advanced Practice Providers (APPs -  Physician Assistants and Nurse Practitioners) who all work together to provide you with the care you need, when you need it.   Your next appointment:    As Needed  The format for your next appointment:   In Person  Provider:   You may see Kristeen Miss, MD or one of the following Advanced Practice Providers on your designated Care Team:    Tereso Newcomer, PA-C  [place], [place]   Other Instructions  Mediterranean Diet A Mediterranean diet refers to food and lifestyle choices that are based on the traditions of countries located on the [place]. This way of eating has been shown to help prevent certain conditions and improve outcomes for people who have chronic diseases, like kidney disease and heart disease. What are tips for following this plan? Lifestyle  Cook and eat meals together with your family, when possible.  Drink enough fluid to keep your urine clear or pale yellow.  Be physically active every day. This  includes: ? Aerobic exercise like running or swimming. ? Leisure activities like gardening, walking, or housework.  Get 7-8 hours of sleep each night.  If recommended by your health care provider, drink red wine in moderation. This means 1 glass a day for nonpregnant women and 2 glasses a day for men. A glass of wine equals 5 oz (150 mL). Reading food labels   Check the serving size of packaged foods. For foods such as rice and pasta, the serving size refers to the amount of cooked product, not dry.  Check the total fat in packaged foods. Avoid foods that have saturated fat or trans fats.  Check the ingredients list for added sugars, such as corn syrup. Shopping  At the grocery store, buy most of your food from the areas near the walls of the store. This includes: ? Fresh fruits and vegetables (produce). ? Grains, beans, nuts, and seeds. Some of these may be available in unpackaged forms or large amounts (in bulk). ? Fresh seafood. ? Poultry and eggs. ? Low-fat dairy products.  Buy whole ingredients instead of prepackaged foods.  Buy fresh fruits and vegetables in-season from local farmers markets.  Buy frozen fruits and vegetables in resealable bags.  If you do not have access to quality fresh seafood, buy precooked frozen shrimp or canned fish, such as tuna, salmon, or sardines.  Buy small amounts of raw or cooked vegetables, salads, or olives from the deli or salad bar at your store.  Stock your pantry so you always have certain  foods on hand, such as olive oil, canned tuna, canned tomatoes, rice, pasta, and beans. Cooking  Cook foods with extra-virgin olive oil instead of using butter or other vegetable oils.  Have meat as a side dish, and have vegetables or grains as your main dish. This means having meat in small portions or adding small amounts of meat to foods like pasta or stew.  Use beans or vegetables instead of meat in common dishes like chili or  lasagna.  Experiment with different cooking methods. Try roasting or broiling vegetables instead of steaming or sauteing them.  Add frozen vegetables to soups, stews, pasta, or rice.  Add nuts or seeds for added healthy fat at each meal. You can add these to yogurt, salads, or vegetable dishes.  Marinate fish or vegetables using olive oil, lemon juice, garlic, and fresh herbs. Meal planning   Plan to eat 1 vegetarian meal one day each week. Try to work up to 2 vegetarian meals, if possible.  Eat seafood 2 or more times a week.  Have healthy snacks readily available, such as: ? Vegetable sticks with hummus. ? [place] yogurt. ? Fruit and nut trail mix.  Eat balanced meals throughout the week. This includes: ? Fruit: 2-3 servings a day ? Vegetables: 4-5 servings a day ? Low-fat dairy: 2 servings a day ? Fish, poultry, or lean meat: 1 serving a day ? Beans and legumes: 2 or more servings a week ? Nuts and seeds: 1-2 servings a day ? Whole grains: 6-8 servings a day ? Extra-virgin olive oil: 3-4 servings a day  Limit red meat and sweets to only a few servings a month What are my food choices?  Mediterranean diet ? Recommended  Grains: Whole-grain pasta. Brown rice. Bulgar wheat. Polenta. Couscous. Whole-wheat bread. [place].  Vegetables: Artichokes. Beets. Broccoli. Cabbage. Carrots. Eggplant. Green beans. Chard. Kale. Spinach. Onions. Leeks. Peas. Squash. Tomatoes. Peppers. Radishes.  Fruits: Apples. Apricots. Avocado. Berries. Bananas. Cherries. Dates. Figs. Grapes. Lemons. Melon. Oranges. Peaches. Plums. Pomegranate.  Meats and other protein foods: Beans. Almonds. Sunflower seeds. Pine nuts. Peanuts. [place]. Salmon. Scallops. Shrimp. [place]. Tilapia. Clams. Oysters. Eggs.  Dairy: Low-fat milk. Cheese. Greek yogurt.  Beverages: Water. Red wine. Herbal tea.  Fats and oils: Extra virgin olive oil. Avocado oil. Grape seed oil.  Sweets and desserts: [place] yogurt with  honey. Baked apples. Poached pears. Trail mix.  Seasoning and other foods: Basil. Cilantro. Coriander. Cumin. Mint. Parsley. Sage. Rosemary. Tarragon. Garlic. Oregano. Thyme. Pepper. Balsalmic vinegar. Tahini. Hummus. Tomato sauce. Olives. Mushrooms. ? Limit these  Grains: Prepackaged pasta or rice dishes. Prepackaged cereal with added sugar.  Vegetables: Deep fried potatoes (french fries).  Fruits: Fruit canned in syrup.  Meats and other protein foods: Beef. Pork. Lamb. Poultry with skin. Hot dogs. Berniece Salines.  Dairy: Ice cream. Sour cream. Whole milk.  Beverages: Juice. Sugar-sweetened soft drinks. Beer. Liquor and spirits.  Fats and oils: Butter. Canola oil. Vegetable oil. Beef fat (tallow). Lard.  Sweets and desserts: Cookies. Cakes. Pies. Candy.  Seasoning and other foods: Mayonnaise. Premade sauces and marinades. The items listed may not be a complete list. Talk with your dietitian about what dietary choices are right for you. Summary  The Mediterranean diet includes both food and lifestyle choices.  Eat a variety of fresh fruits and vegetables, beans, nuts, seeds, and whole grains.  Limit the amount of red meat and sweets that you eat.  Talk with your health care provider about whether it is safe for you  to drink red wine in moderation. This means 1 glass a day for nonpregnant women and 2 glasses a day for men. A glass of wine equals 5 oz (150 mL). This information is not intended to replace advice given to you by your health care provider. Make sure you discuss any questions you have with your health care provider. Document Revised: 12/28/2015 Document Reviewed: 12/21/2015 Elsevier Patient Education  2020 [employer].

## 2020-06-02 ENCOUNTER — Encounter: Payer: Self-pay | Admitting: Gastroenterology

## 2020-06-17 ENCOUNTER — Ambulatory Visit
Admission: EM | Admit: 2020-06-17 | Discharge: 2020-06-17 | Disposition: A | Discharge status: Home / Self Care | Payer: BC Managed Care – PPO | Attending: Emergency Medicine | Admitting: Emergency Medicine

## 2020-06-17 ENCOUNTER — Encounter: Payer: Self-pay | Admitting: Emergency Medicine

## 2020-06-17 ENCOUNTER — Other Ambulatory Visit: Payer: Self-pay

## 2020-06-17 DIAGNOSIS — R5383 Other fatigue: Secondary | ICD-10-CM

## 2020-06-17 NOTE — Discharge Instructions (Signed)
Blood work pending - I will call if abnormal Focus on fluids, rehydration Follow up if not improving or worsening

## 2020-06-17 NOTE — ED Triage Notes (Signed)
Pt said last Saturday thru Wednesday had diarrhea and stopped on Wednesday. Pt said Friday ate hot dogs and had diarrhea again. Went away. Then yesterday began having weakness, and feeling tired and not himself. Negative Covid test.

## 2020-06-17 NOTE — ED Provider Notes (Signed)
[name]    CSN: [id number] Arrival date & time: 06/17/20  1013      History   Chief Complaint Chief Complaint  Patient presents with  . Weakness    HPI Roan Noecker is a 50 y.o. male presenting today for evaluation of generalized weakness.  Reports that he has had diarrhea intermittently over the past week.  Improved midweek, but then restarted.  Yesterday began to have generalized weakness and fatigue and not feeling himself.  Reports prior Covid test negative. Tried drinking water. Diarrhea has subsided. Denies nausea or vomiting.  Denies any pain.  Has felt slightly dizzy/lightheaded.  Return to normal oral intake.  HPI  Past Medical History:  Diagnosis Date  . Allergy    Phreesia 01/11/2020  . Anxiety    Phreesia 01/11/2020  . Depression    Phreesia 04/17/2020  . GERD (gastroesophageal reflux disease)    Phreesia 04/17/2020  . Hemorrhoid    colonoscopy was normal 2012  . 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 Atypical chest pain 05/18/2020  . Palpitations 02/15/2019    History reviewed. No pertinent surgical history.     Home Medications    Prior to Admission medications   Medication Sig Start Date End Date Taking? Authorizing Provider  hydrOXYzine (ATARAX/VISTARIL) 25 MG tablet Take 1 tablet (25 mg total) by mouth 3 (three) times daily as needed. 04/17/20   Arvilla Market, DO    Family History Family History  Problem Relation Age of Onset  . Hypertension Paternal Grandmother   . Hypertension Paternal Grandfather     Social History Social History   Tobacco Use  . Smoking status: Never Smoker  . Smokeless tobacco: Never Used  Substance Use Topics  . Alcohol use: Yes    Comment: social drinker  . Drug use: No     Allergies   Patient has no known allergies.   Review of Systems Review of Systems   Constitutional: Positive for fatigue. Negative for activity change, appetite change, chills and fever.  HENT: Negative for congestion, ear pain, rhinorrhea, sinus pressure, sore throat and trouble swallowing.   Eyes: Negative for discharge and redness.  Respiratory: Negative for cough, chest tightness and shortness of breath.   Cardiovascular: Negative for chest pain.  Gastrointestinal: Negative for abdominal pain, diarrhea, nausea and vomiting.  Musculoskeletal: Negative for myalgias.  Skin: Negative for rash.  Neurological: Negative for dizziness, light-headedness and headaches.     Physical Exam Triage Vital Signs ED Triage Vitals [06/17/20 1024]  Enc Vitals Group     BP      Pulse      Resp      Temp      Temp src      SpO2      Weight      Height      Head Circumference      Peak Flow      Pain Score 0     Pain Loc      Pain Edu?      Excl. in GC?    No data found.  Updated Vital Signs BP 121/70 (BP Location: Right Arm)   Pulse 71   Temp 98.6 F (37 C) (Oral)   Resp 16   SpO2 98%   Visual Acuity Right Eye Distance:   Left Eye Distance:   Bilateral Distance:  Right Eye Near:   Left Eye Near:    Bilateral Near:     Physical Exam Vitals and nursing note reviewed.  Constitutional:      Appearance: He is well-developed and well-nourished.     Comments: No acute distress  HENT:     Head: Normocephalic and atraumatic.     Ears:     Comments: Bilateral ears without tenderness to palpation of external auricle, tragus and mastoid, EAC's without erythema or swelling, TM's with good bony landmarks and cone of light. Non erythematous.     Nose: Nose normal.     Mouth/Throat:     Comments: Oral mucosa pink and moist, no tonsillar enlargement or exudate. Posterior pharynx patent and nonerythematous, no uvula deviation or swelling. Normal phonation. Eyes:     Extraocular Movements: Extraocular movements intact.     Conjunctiva/sclera: Conjunctivae normal.      Pupils: Pupils are equal, round, and reactive to light.  Cardiovascular:     Rate and Rhythm: Normal rate.  Pulmonary:     Effort: Pulmonary effort is normal. No respiratory distress.     Comments: Breathing comfortably at rest, CTABL, no wheezing, rales or other adventitious sounds auscultated Abdominal:     General: There is no distension.     Comments: Soft, nondistended, nontender light deep palpation  Musculoskeletal:        General: Normal range of motion.     Cervical back: Neck supple.  Skin:    General: Skin is warm and dry.  Neurological:     Mental Status: He is alert and oriented to person, place, and time.  Psychiatric:        Mood and Affect: Mood and affect normal.      UC Treatments / Results  Labs (all labs ordered are listed, but only abnormal results are displayed) Labs Reviewed  COMPREHENSIVE METABOLIC PANEL  CBC WITH DIFFERENTIAL/PLATELET    EKG   Radiology No results found.  Procedures Procedures (including critical care time)  Medications Ordered in UC Medications - No data to display  Initial Impression / Assessment and Plan / UC Course  I have reviewed the triage vital signs and the nursing notes.  Pertinent labs & imaging results that were available during my care of the patient were reviewed by me and considered in my medical decision making (see chart for details).     Generalized fatigue/weakness-exam reassuring, vital signs stable, checking basic labs of CMP and CBC, recommended oral rehydration and focus on rehydration after 1 week of diarrhea.  Monitor for gradual resolution, follow-up with PCP if labs normal and symptoms persisting.  Discussed strict return precautions. Patient verbalized understanding and is agreeable with plan.  Final Clinical Impressions(s) / UC Diagnoses   Final diagnoses:  Fatigue, unspecified type     Discharge Instructions     Blood work pending - I will call if abnormal Focus on fluids,  rehydration Follow up if not improving or worsening    ED Prescriptions    None     PDMP not reviewed this encounter.   Lew Dawes, PA-C 06/17/20 1153

## 2020-06-18 LAB — CBC WITH DIFFERENTIAL/PLATELET
Basophils Absolute: 0 10*3/uL (ref 0.0–0.2)
Basos: 1 %
EOS (ABSOLUTE): 0 10*3/uL (ref 0.0–0.4)
Eos: 0 %
Hematocrit: 47.1 % (ref 37.5–51.0)
Hemoglobin: 15.3 g/dL (ref 13.0–17.7)
Immature Grans (Abs): 0 10*3/uL (ref 0.0–0.1)
Immature Granulocytes: 0 %
Lymphocytes Absolute: 2.5 10*3/uL (ref 0.7–3.1)
Lymphs: 40 %
MCH: 26.7 pg (ref 26.6–33.0)
MCHC: 32.5 g/dL (ref 31.5–35.7)
MCV: 82 fL (ref 79–97)
Monocytes Absolute: 0.3 10*3/uL (ref 0.1–0.9)
Monocytes: 5 %
Neutrophils Absolute: 3.4 10*3/uL (ref 1.4–7.0)
Neutrophils: 54 %
Platelets: 278 10*3/uL (ref 150–450)
RBC: 5.73 x10E6/uL (ref 4.14–5.80)
RDW: 12.5 % (ref 11.6–15.4)
WBC: 6.3 10*3/uL (ref 3.4–10.8)

## 2020-06-18 LAB — COMPREHENSIVE METABOLIC PANEL
ALT: 41 IU/L (ref 0–44)
AST: 30 IU/L (ref 0–40)
Albumin/Globulin Ratio: 1.8 (ref 1.2–2.2)
Albumin: 4.8 g/dL (ref 4.0–5.0)
Alkaline Phosphatase: 107 IU/L (ref 44–121)
BUN/Creatinine Ratio: 8 — ABNORMAL LOW (ref 9–20)
BUN: 11 mg/dL (ref 6–24)
Bilirubin Total: 0.6 mg/dL (ref 0.0–1.2)
CO2: 25 mmol/L (ref 20–29)
Calcium: 10 mg/dL (ref 8.7–10.2)
Chloride: 102 mmol/L (ref 96–106)
Creatinine, Ser: 1.37 mg/dL — ABNORMAL HIGH (ref 0.76–1.27)
GFR calc Af Amer: 70 mL/min/{1.73_m2} (ref >59)
GFR calc non Af Amer: 61 mL/min/{1.73_m2} (ref >59)
Globulin, Total: 2.7 g/dL (ref 1.5–4.5)
Glucose: 89 mg/dL (ref 65–99)
Potassium: 4.5 mmol/L (ref 3.5–5.2)
Sodium: 140 mmol/L (ref 134–144)
Total Protein: 7.5 g/dL (ref 6.0–8.5)

## 2020-07-07 ENCOUNTER — Telehealth: Payer: Self-pay | Admitting: *Deleted

## 2020-07-07 NOTE — Telephone Encounter (Signed)
Dr. Orvan Falconer,  This pt ws seen by cardiology on 05-18-20 for atypical chest pain.    Dr. Elease Hashimoto had some recommendations for the pt, but didn't order any labs or tests.  His next appt is only as needed.  I just wanted to make you aware of this, and make sure ok to proceed with direct colonoscopy.  Thanks, [employer]

## 2020-07-07 NOTE — Telephone Encounter (Signed)
Cardiology records reviewed. OK to proceed with colonoscopy. Thanks.

## 2020-07-14 ENCOUNTER — Telehealth: Payer: Self-pay | Admitting: *Deleted

## 2020-07-14 NOTE — Telephone Encounter (Signed)
Missed pre-visit appointment.  Was told Matthew Cunningham wouldn't be available until after 5 pm. Left message requesting patient to call back and reschedule previsit to avoid cancellation of up-coming colonoscopy.

## 2020-07-17 NOTE — Telephone Encounter (Signed)
No return phone call. Colonoscopy cancelled and missed appointment letter sent.

## 2020-07-31 ENCOUNTER — Encounter: Payer: BC Managed Care – PPO | Admitting: Gastroenterology

## 2020-11-06 NOTE — Progress Notes (Signed)
Patient did not show for appointment.   

## 2020-11-07 ENCOUNTER — Encounter: Payer: BC Managed Care – PPO | Admitting: Family

## 2021-05-01 NOTE — Progress Notes (Deleted)
Patient ID: Matthew Cunningham, male    DOB: 06/18/1970  MRN: [id number]  CC: Annual Physical Exam   Subjective: Matthew Cunningham is a 50 y.o. male who presents for annual physical exam.   His concerns today include:   NEED FLU  NEED COLONOSCOPY   Patient Active Problem List   Diagnosis Date Noted   Atypical chest pain 05/18/2020   Palpitations 02/15/2019     Current Outpatient Medications on File Prior to Visit  Medication Sig Dispense Refill   hydrOXYzine (ATARAX/VISTARIL) 25 MG tablet Take 1 tablet (25 mg total) by mouth 3 (three) times daily as needed. 90 tablet 2   No current facility-administered medications on file prior to visit.    No Known Allergies  Social History   Socioeconomic History   Marital status: Single    Spouse name: Not on file   Number of children: Not on file   Years of education: Not on file   Highest education level: Not on file  Occupational History   Not on file  Tobacco Use   Smoking status: Never   Smokeless tobacco: Never  Substance and Sexual Activity   Alcohol use: Yes    Comment: social drinker   Drug use: No   Sexual activity: Yes  Other Topics Concern   Not on file  Social History Narrative   Not on file   Social Determinants of Health   Financial Resource Strain: Not on file  Food Insecurity: Not on file  Transportation Needs: Not on file  Physical Activity: Not on file  Stress: Not on file  Social Connections: Not on file  Intimate Partner Violence: Not on file    Family History  Problem Relation Age of Onset   Hypertension Paternal Grandmother    Hypertension Paternal Grandfather     No past surgical history on file.  ROS: Review of Systems Negative except as stated above  PHYSICAL EXAM: There were no vitals taken for this visit.  Physical Exam  {male adult master:310786} {male adult master:310785}  CMP Latest Ref Rng & Units 06/17/2020 05/11/2020 02/15/2019  Glucose 65 - 99 mg/dL 89 94 951(O)   BUN 6 - 24 mg/dL 11 12 12   Creatinine 0.76 - 1.27 mg/dL ) 8.41(Y) 6.06(T)  Sodium 134 - 144 mmol/L 140 137 140  Potassium 3.5 - 5.2 mmol/L 4.5 4.3 4.9  Chloride 96 - 106 mmol/L 102 99 100  CO2 20 - 29 mmol/L 25 25 24   Calcium 8.7 - 10.2 mg/dL 0.16(W 9.9  Total Protein 6.0 - 8.5 g/dL 7.5 7.9 7.8  Total Bilirubin 0.0 - 1.2 mg/dL 0.6 0.6 0.5  Alkaline Phos 44 - 121 IU/L 107 122(H) 99  AST 0 - 40 IU/L 30 30 26   ALT 0 - 44 IU/L 41 50(H) 33   Lipid Panel     Component Value Date/Time   CHOL 150 05/11/2020 0843   TRIG 94 05/11/2020 0843   HDL 44 05/11/2020 0843   CHOLHDL 3.4 05/11/2020 0843   LDLCALC 88 05/11/2020 0843    CBC    Component Value Date/Time   WBC 6.3 06/17/2020 1115   WBC 8.5 10/27/2012 2056   RBC 5.73 06/17/2020 1115   RBC 5.26 10/27/2012 2056   HGB 15.3 06/17/2020 1115   HCT 47.1 06/17/2020 1115   PLT 278 06/17/2020 1115   MCV 82 06/17/2020 1115   MCH 26.7 06/17/2020 1115   MCH 29.1 10/27/2012 2056   MCHC 32.5 06/17/2020 1115  MCHC 34.8 10/27/2012 2056   RDW 12.5 06/17/2020 1115   LYMPHSABS 2.5 06/17/2020 1115   MONOABS 0.8 10/27/2012 2056   EOSABS 0.0 06/17/2020 1115   BASOSABS 0.0 06/17/2020 1115    ASSESSMENT AND PLAN:  There are no diagnoses linked to this encounter.   Patient was given the opportunity to ask questions.  Patient verbalized understanding of the plan and was able to repeat key elements of the plan. Patient was given clear instructions to go to Emergency Department or return to medical center if symptoms don't improve, worsen, or new problems develop.The patient verbalized understanding.   No orders of the defined types were placed in this encounter.    Requested Prescriptions    No prescriptions requested or ordered in this encounter    No follow-ups on file.  Rema Fendt, NP

## 2021-05-11 ENCOUNTER — Encounter: Payer: Self-pay | Admitting: Family

## 2022-02-12 ENCOUNTER — Ambulatory Visit: Payer: BC Managed Care – PPO | Admitting: Cardiology

## 2022-02-12 ENCOUNTER — Encounter: Payer: Self-pay | Admitting: Cardiology

## 2022-02-12 VITALS — BP 138/84 | HR 69 | Temp 97.7°F | Resp 16 | Ht 68.0 in | Wt 225.4 lb

## 2022-02-12 DIAGNOSIS — R072 Precordial pain: Secondary | ICD-10-CM

## 2022-02-12 DIAGNOSIS — Z6834 Body mass index (BMI) 34.0-34.9, adult: Secondary | ICD-10-CM

## 2022-02-12 DIAGNOSIS — I10 Essential (primary) hypertension: Secondary | ICD-10-CM

## 2022-02-12 DIAGNOSIS — E782 Mixed hyperlipidemia: Secondary | ICD-10-CM

## 2022-02-12 DIAGNOSIS — E6609 Other obesity due to excess calories: Secondary | ICD-10-CM

## 2022-02-12 NOTE — Progress Notes (Signed)
ID:  Matthew Cunningham, DOB 04/06/1971, MRN [id number]  PCP:  Ashley Akin, NP  Cardiologist:  Rex Kras, DO, [facility] (established care 02/12/22)  REASON FOR CONSULT: Chest pain  REQUESTING PHYSICIAN:  Ashley Akin, NP [address] [place],  [place] [id number]  Chief Complaint  Patient presents with   Chest Pain   New Patient (Initial Visit)    HPI  Matthew Cunningham is a 51 y.o. African-American male who presents to the clinic for evaluation of chest pain at the request of Ashley Akin, NP. His past medical history and cardiovascular risk factors include: Hypertension, hyperlipidemia, depression.  Patient is here for evaluation of chest pain.  Onset few months ago, predominately associated with stressful situations, lasting for few seconds, located across his anterior chest wall, sharp-like sensation, no discomfort between the shoulder blades, no syncope, nonradiating, not brought on by effort related activities, does not resolve with rest, self-limited.  No active chest pain.  Based on the records provided by referring physician he does carry a history of hypertension and hyperlipidemia but not on medical therapy.  No family history of premature coronary disease or sudden cardiac death.  FUNCTIONAL STATUS: Exercises twice a week, 15 minutes each, predominantly walking.  ALLERGIES: No Known Allergies  MEDICATION LIST PRIOR TO VISIT: No outpatient medications have been marked as taking for the 02/12/22 encounter (Office Visit) with Terri Skains, Laurice Kimmons, DO.     PAST MEDICAL HISTORY: Past Medical History:  Diagnosis Date   Allergy    Phreesia 01/11/2020   Anxiety    Phreesia 01/11/2020   Depression    Phreesia 04/17/2020   GERD (gastroesophageal reflux disease)    Phreesia 04/17/2020   Hemorrhoid    colonoscopy was normal 2012   HSV-2 (herpes simplex virus 2) infection    positive antibodies, no hx of outbreak   Hyperlipidemia    Hypertension     PAST SURGICAL  HISTORY: History reviewed. No pertinent surgical history.  FAMILY HISTORY: The patient family history includes Hypertension in his paternal grandfather and paternal grandmother.  SOCIAL HISTORY:  The patient  reports that he has never smoked. He has never used smokeless tobacco. He reports current alcohol use. He reports that he does not use drugs.  REVIEW OF SYSTEMS: Review of Systems  Cardiovascular:  Positive for chest pain (See HPI). Negative for claudication, dyspnea on exertion, irregular heartbeat, leg swelling, near-syncope, orthopnea, palpitations, paroxysmal nocturnal dyspnea and syncope.  Respiratory:  Negative for shortness of breath.   Hematologic/Lymphatic: Negative for bleeding problem.  Musculoskeletal:  Negative for muscle cramps and myalgias.  Neurological:  Negative for dizziness and light-headedness.    PHYSICAL EXAM:    02/12/2022   10:47 AM 06/17/2020   10:39 AM 05/18/2020   11:21 AM  Vitals with BMI  Height _0   _1   Weight 225 lbs 6 oz  218 lbs  BMI 71.24  58.09  Systolic 983 382 505  Diastolic 84 70 82  Pulse 69 71 70    Physical Exam  Constitutional: No distress.  Age appropriate, hemodynamically stable.   Neck: No JVD present.  Cardiovascular: Normal rate, regular rhythm, S1 normal, S2 normal, intact distal pulses and normal pulses. Exam reveals no gallop, no S3 and no S4.  No murmur heard. Pulmonary/Chest: Effort normal and breath sounds normal. No stridor. He has no wheezes. He has no rales.  Abdominal: Soft. Bowel sounds are normal. He exhibits no distension. There is no abdominal tenderness.  Musculoskeletal:  General: No edema.     Cervical back: Neck supple.  Neurological: He is alert and oriented to person, place, and time. He has intact cranial nerves (2-12).  Skin: Skin is warm and moist.   CARDIAC DATABASE: EKG: 02/12/2022: Normal sinus rhythm, 74 bpm, without underlying ischemia or injury pattern.  Echocardiogram: No  results found for this or any previous visit from the past 1095 days.    Stress Testing: No results found for this or any previous visit from the past 1095 days.   Heart Catheterization: None  LABORATORY DATA:    Latest Ref Rng & Units 06/17/2020   11:15 AM 05/11/2020    8:43 AM 02/15/2019    9:19 AM  CBC  WBC 3.4 - 10.8 x10E3/uL 6.3  10.2  7.8   Hemoglobin 13.0 - 17.7 g/dL 15.3  15.4  15.7   Hematocrit 37.5 - 51.0 % 47.1  49.5  47.3   Platelets 150 - 450 x10E3/uL 278  270  281        Latest Ref Rng & Units 06/17/2020   11:15 AM 05/11/2020    8:43 AM 02/15/2019    9:19 AM  CMP  Glucose 65 - 99 mg/dL 89  94  106   BUN 6 - 24 mg/dL _0 Creatinine 0.76 - 1.27 mg/dL 1.37  1.28  1.31   Sodium 134 - 144 mmol/L 140  137  140   Potassium 3.5 - 5.2 mmol/L 4.5  4.3  4.9   Chloride 96 - 106 mmol/L 102  99  100   CO2 20 - 29 mmol/L _1 Calcium 8.7 - 10.2 mg/dL 10.0  9.9  10.2   Total Protein 6.0 - 8.5 g/dL 7.5  7.9  7.8   Total Bilirubin 0.0 - 1.2 mg/dL 0.6  0.6  0.5   Alkaline Phos 44 - 121 IU/L 107  122  99   AST 0 - 40 IU/L _2 ALT 0 - 44 IU/L 41  50  33     Lipid Panel     Component Value Date/Time   CHOL 150 05/11/2020 0843   TRIG 94 05/11/2020 0843   HDL 44 05/11/2020 0843   CHOLHDL 3.4 05/11/2020 0843   LDLCALC 88 05/11/2020 0843   LABVLDL 18 05/11/2020 0843    No components found for: "NTPROBNP" No results for input(s): "PROBNP" in the last 8760 hours. No results for input(s): "TSH" in the last 8760 hours.  BMP No results for input(s): "NA", "K", "CL", "CO2", "GLUCOSE", "BUN", "CREATININE", "CALCIUM", "GFRNONAA", "GFRAA" in the last 8760 hours.  HEMOGLOBIN A1C Lab Results  Component Value Date   HGBA1C 5.0 05/11/2020   External Labs: Collected: 09/13/2020. BUN 14, creatinine 1.45. eGFR 66. Sodium 139, potassium 4.3, chloride 104, bicarb 26,. AST 23, ALT 33, alkaline phosphatase 90. Hemoglobin A1c 4.9. Hemoglobin 15.6 g/dL,  hematocrit 48%.   IMPRESSION:    ICD-10-CM   1. Precordial pain  R07.2 EKG 12-Lead    PCV ECHOCARDIOGRAM COMPLETE    PCV CARDIAC STRESS TEST    2. Benign hypertension  I10     3. Mixed hyperlipidemia  E78.2     4. Class 1 obesity due to excess calories without serious comorbidity with body mass index (BMI) of 34.0 to 34.9 in adult  E66.09    Z68.34        RECOMMENDATIONS: Matthew Cunningham is a 51 y.o.  African-American male whose past medical history and cardiac risk factors include: Hypertension, hyperlipidemia, depression.    Precordial pain Symptoms suggestive of noncardiac etiology. EKG: Nonischemic. Echo will be ordered to evaluate for structural heart disease and left ventricular systolic function. Exercise treadmill stress test to evaluate for functional capacity, and exercise-induced arrhythmia/dysrhythmia.  Educated on the importance of improving his modifiable cardiovascular risk factors.   Benign hypertension  / Mixed hyperlipidemia Carries a history of hypertension and hyperlipidemia. Currently not on pharmacological therapy. We will defer management to PCP.  Class 1 obesity due to excess calories without serious comorbidity with body mass index (BMI) of 34.0 to 34.9 in adult Body mass index is 34.27 kg/m. I reviewed with the patient the importance of diet, regular physical activity/exercise, weight loss.   Patient is educated on increasing physical activity gradually as tolerated.  With the goal of moderate intensity exercise for 30 minutes a day 5 days a week.  Data Reviewed: I have independently reviewed external notes provided by the referring provider as part of this office visit.   I have independently reviewed EKG, labs as part of medical decision making. I have ordered the following tests:  Orders Placed This Encounter  Procedures   PCV CARDIAC STRESS TEST    Standing Status:   Future    Standing Expiration Date:   02/13/2023   EKG 12-Lead   PCV  ECHOCARDIOGRAM COMPLETE    Standing Status:   Future    Standing Expiration Date:   02/13/2023  I have made no medications changes at today's encounter as noted above.  FINAL MEDICATION LIST END OF ENCOUNTER: No orders of the defined types were placed in this encounter.   Medications Discontinued During This Encounter  Medication Reason   hydrOXYzine (ATARAX/VISTARIL) 25 MG tablet     No current outpatient medications on file.  Orders Placed This Encounter  Procedures   PCV CARDIAC STRESS TEST   EKG 12-Lead   PCV ECHOCARDIOGRAM COMPLETE    There are no Patient Instructions on file for this visit.   --Continue cardiac medications as reconciled in final medication list. --Return if symptoms worsen or fail to improve. or sooner if needed. --Continue follow-up with your primary care physician regarding the management of your other chronic comorbid conditions.  Patient's questions and concerns were addressed to his satisfaction. He voices understanding of the instructions provided during this encounter.   This note was created using a voice recognition software as a result there may be grammatical errors inadvertently enclosed that do not reflect the nature of this encounter. Every attempt is made to correct such errors.  Rex Kras, [place], [facility]  Pager: [phone] Office: [phone]

## 2022-02-18 ENCOUNTER — Other Ambulatory Visit: Payer: BC Managed Care – PPO

## 2022-02-28 ENCOUNTER — Ambulatory Visit: Payer: BC Managed Care – PPO

## 2022-02-28 DIAGNOSIS — R072 Precordial pain: Secondary | ICD-10-CM

## 2022-03-07 NOTE — Progress Notes (Signed)
Called and spoke to patient he voiced understanding and will schedule once he has his stress test

## 2022-04-12 ENCOUNTER — Ambulatory Visit: Payer: BC Managed Care – PPO

## 2022-04-12 DIAGNOSIS — R072 Precordial pain: Secondary | ICD-10-CM

## 2022-04-17 NOTE — Progress Notes (Signed)
Spoke with patient about test results. He had no questions and acknowledged understanding. He was at work and had to get off the phone so he agreed to call back to schedule an appointment to discuss the results.

## 2022-06-13 NOTE — Progress Notes (Signed)
This encounter was created in error - please disregard.

## 2023-04-09 ENCOUNTER — Emergency Department (HOSPITAL_COMMUNITY)
Admission: EM | Admit: 2023-04-09 | Discharge: 2023-04-09 | Disposition: A | Discharge status: Home / Self Care | Payer: BC Managed Care – PPO | Attending: Emergency Medicine | Admitting: Emergency Medicine

## 2023-04-09 ENCOUNTER — Other Ambulatory Visit: Payer: Self-pay

## 2023-04-09 ENCOUNTER — Emergency Department (HOSPITAL_COMMUNITY): Payer: BC Managed Care – PPO

## 2023-04-09 ENCOUNTER — Encounter (HOSPITAL_COMMUNITY): Payer: Self-pay

## 2023-04-09 DIAGNOSIS — I10 Essential (primary) hypertension: Secondary | ICD-10-CM | POA: Diagnosis not present

## 2023-04-09 DIAGNOSIS — R0789 Other chest pain: Secondary | ICD-10-CM | POA: Diagnosis present

## 2023-04-09 DIAGNOSIS — Z79899 Other long term (current) drug therapy: Secondary | ICD-10-CM | POA: Diagnosis not present

## 2023-04-09 DIAGNOSIS — F17200 Nicotine dependence, unspecified, uncomplicated: Secondary | ICD-10-CM | POA: Diagnosis not present

## 2023-04-09 DIAGNOSIS — R079 Chest pain, unspecified: Secondary | ICD-10-CM

## 2023-04-09 LAB — CBC
HCT: 47.7 % (ref 39.0–52.0)
Hemoglobin: 15.4 g/dL (ref 13.0–17.0)
MCH: 26.7 pg (ref 26.0–34.0)
MCHC: 32.3 g/dL (ref 30.0–36.0)
MCV: 82.7 fL (ref 80.0–100.0)
Platelets: 274 10*3/uL (ref 150–400)
RBC: 5.77 MIL/uL (ref 4.22–5.81)
RDW: 13.2 % (ref 11.5–15.5)
WBC: 9.8 10*3/uL (ref 4.0–10.5)
nRBC: 0 % (ref 0.0–0.2)

## 2023-04-09 LAB — BASIC METABOLIC PANEL
Anion gap: 10 (ref 5–15)
BUN: 13 mg/dL (ref 6–20)
CO2: 23 mmol/L (ref 22–32)
Calcium: 9.5 mg/dL (ref 8.9–10.3)
Chloride: 103 mmol/L (ref 98–111)
Creatinine, Ser: 1.37 mg/dL — ABNORMAL HIGH (ref 0.61–1.24)
GFR, Estimated: >60 mL/min (ref >60)
Glucose, Bld: 88 mg/dL (ref 70–99)
Potassium: 3.9 mmol/L (ref 3.5–5.1)
Sodium: 136 mmol/L (ref 135–145)

## 2023-04-09 LAB — TROPONIN I (HIGH SENSITIVITY)
Troponin I (High Sensitivity): 9 ng/L (ref <18)
Troponin I (High Sensitivity): 9 ng/L (ref <18)

## 2023-04-09 MED ORDER — FAMOTIDINE 20 MG PO TABS
20.0000 mg | ORAL_TABLET | Freq: Once | ORAL | Status: AC
  Administered 2023-04-09: 20 mg via ORAL
  Filled 2023-04-09: qty 1

## 2023-04-09 MED ORDER — OMEPRAZOLE 20 MG PO CPDR: 20.0000 mg | DELAYED_RELEASE_CAPSULE | Freq: Every day | ORAL | 0 refills | Status: DC

## 2023-04-09 MED ORDER — ACETAMINOPHEN 500 MG PO TABS
1000.0000 mg | ORAL_TABLET | Freq: Once | ORAL | Status: AC
  Administered 2023-04-09: 1000 mg via ORAL
  Filled 2023-04-09: qty 2

## 2023-04-09 NOTE — ED Provider Notes (Signed)
[facility] AT [facility] Provider Note   CSN: [id number] Arrival date & time: 04/09/23  1904     History Chief Complaint  Patient presents with   Chest Pain    HPI Matthew Cunningham is a 52 y.o. male presenting for chief complaint of chest pain.  52 year old male. States that he has had intermittent chest pain over the past few years.  Seen by cardiologist underwent a stress EKG that was nonischemic last December.  Symptoms usually flare when he is under significant stress at work and he states today was another day of relatively high psychosocial intensity. States he started having left-sided chest pain radiating into his left shoulder.  Denied fevers chills nausea vomiting syncope shortness of breath.  Symptoms are subsiding at this time. No family history of heart disease patient does not smoke or have any other diagnosed medical problems.  Patient's recorded medical, surgical, social, medication list and allergies were reviewed in the Snapshot window as part of the initial history.   Review of Systems   Review of Systems  Constitutional:  Negative for chills and fever.  HENT:  Negative for ear pain and sore throat.   Eyes:  Negative for pain and visual disturbance.  Respiratory:  Negative for cough and shortness of breath.   Cardiovascular:  Positive for chest pain. Negative for palpitations.  Gastrointestinal:  Negative for abdominal pain and vomiting.  Genitourinary:  Negative for dysuria and hematuria.  Musculoskeletal:  Negative for arthralgias and back pain.  Skin:  Negative for color change and rash.  Neurological:  Negative for seizures and syncope.  All other systems reviewed and are negative.   Physical Exam Updated Vital Signs BP (!) 138/101   Pulse 94   Temp 99.3 F (37.4 C)   Resp 16   Ht 5\' 8"  (1.727 m)   Wt 102.2 kg   SpO2 96%   BMI 34.26 kg/m  Physical Exam Vitals and nursing note reviewed.  Constitutional:       General: He is not in acute distress.    Appearance: He is well-developed.  HENT:     Head: Normocephalic and atraumatic.  Eyes:     Conjunctiva/sclera: Conjunctivae normal.  Cardiovascular:     Rate and Rhythm: Normal rate and regular rhythm.     Heart sounds: No murmur heard. Pulmonary:     Effort: Pulmonary effort is normal. No respiratory distress.     Breath sounds: Normal breath sounds.  Abdominal:     Palpations: Abdomen is soft.     Tenderness: There is no abdominal tenderness.  Musculoskeletal:        General: No swelling.     Cervical back: Neck supple.  Skin:    General: Skin is warm and dry.     Capillary Refill: Capillary refill takes less than 2 seconds.  Neurological:     Mental Status: He is alert.  Psychiatric:        Mood and Affect: Mood normal.      ED Course/ Medical Decision Making/ A&P    Procedures Procedures   Medications Ordered in ED Medications  acetaminophen (TYLENOL) tablet 1,000 mg (1,000 mg Oral Given 04/09/23 1957)  famotidine (PEPCID) tablet 20 mg (20 mg Oral Given 04/09/23 1957)   Medical Decision Making: Matthew Cunningham is a 52 y.o. male who presented to the ED today with chest pain, detailed above.  Based on patient's comorbidities, patient has a heart score of 3.    Additional  history discussed with patient's family/caregivers.  Patient placed on continuous vitals and telemetry monitoring while in ED which was reviewed periodically.  Complete initial physical exam performed, notably the patient was HDS in NAD.   Reviewed and confirmed nursing documentation for past medical history, family history, social history.    Initial Assessment: With the patient's presentation of left-sided chest pain, most likely diagnosis is musculoskeletal chest pain versus GERD, although ACS remains on the differential. Other diagnoses were considered including (but not limited to) pulmonary embolism, community-acquired pneumonia, aortic dissection,  pneumothorax, underlying bony abnormality, anemia. These are considered less likely due to history of present illness and physical exam findings.    In particular, concerning pulmonary embolism: they deny malignancy, recent surgery, history of DVT, or calf tenderness leading to a low risk Wells score. Aortic Dissection also reconsidered but seems less likely based on the location, quality, onset, and severity of symptoms in this case. Patient has a lack of serious comorbidities for this condition including a lack of HTN or Smoking. Patient also has a lack of underlying history of AD or TAA.  This is most consistent with an acute life/limb threatening illness complicated by underlying chronic conditions.   Initial Plan: Evaluate for ACS with delta troponin and EKG evaluated as below  Evaluate for dissection, bony abnormality, or pneumonia with chest x-ray and screening laboratory evaluation including CBC, BMP  Further evaluation for pulmonary embolism not indicated at this time based on patient's PERC and Wells score.  Further evaluation for Thoracic Aortic Dissection not indicated at this time based on patient's clinical history and PE findings.   Initial Study Results: EKG was reviewed independently. Rate, rhythm, axis, intervals all examined and without medically relevant abnormality. ST segments without concerns for elevations.    Laboratory  Delta troponin demonstrated NAA   CBC and BMP without obvious metabolic or inflammatory abnormalities requiring further evaluation   Radiology  DG Chest 2 View  Result Date: 04/09/2023 CLINICAL DATA:  Chest pain. EXAM: CHEST - 2 VIEW COMPARISON:  February 03, 2019 FINDINGS: The heart size and mediastinal contours are within normal limits. Mild bilateral infrahilar atelectatic changes are seen. There is no evidence of an acute infiltrate, pleural effusion or pneumothorax. The visualized skeletal structures are unremarkable. IMPRESSION: Mild bilateral  infrahilar atelectatic changes. Electronically Signed   By: Aram Candela M.D.   On: 04/09/2023 20:04     Reassessment and Plan:   On repeat clinical assessment, patient has had complete symptomatic resolution after administration of Pepcid and Toradol.  They are currently ambulatory tolerating p.o. intake.  They deny fevers or chills, nausea vomiting, syncope shortness of breath.  Favor likely musculoskeletal versus gastroesophageal etiology of patient's symptoms.  Considered ACS grossly less likely given resolution, well appearance and negative troponin and low HEART score.  Patient to follow-up with primary care provider within 48 hours for ongoing care and management.  Disposition:  I have considered need for hospitalization, however, considering all of the above, I believe this patient is stable for discharge at this time.  Patient/family educated about specific return precautions for given chief complaint and symptoms.  Patient/family educated about follow-up with PCP/cardiology.     Patient/family expressed understanding of return precautions and need for follow-up. Patient spoken to regarding all imaging and laboratory results and appropriate follow up for these results. All education provided in verbal form with additional information in written form. Time was allowed for answering of patient questions. Patient discharged.    Emergency Department  Medication Summary:   Medications  acetaminophen (TYLENOL) tablet 1,000 mg (1,000 mg Oral Given 04/09/23 1957)  famotidine (PEPCID) tablet 20 mg (20 mg Oral Given 04/09/23 1957)         Clinical Impression:  1. Chest pain, unspecified type      Data Unavailable   Final Clinical Impression(s) / ED Diagnoses Final diagnoses:  Chest pain, unspecified type    Rx / DC Orders ED Discharge Orders          Ordered    omeprazole (PRILOSEC) 20 MG capsule  Daily        04/09/23 2221              Glyn Ade,  MD 04/09/23 2221

## 2023-04-09 NOTE — ED Triage Notes (Signed)
Patient reports left chest pain onset this morning , denies SOB , no emesis or diaphoresis .

## 2023-07-13 ENCOUNTER — Encounter (HOSPITAL_COMMUNITY): Payer: Self-pay

## 2023-07-13 ENCOUNTER — Ambulatory Visit (HOSPITAL_COMMUNITY)
Admission: EM | Admit: 2023-07-13 | Discharge: 2023-07-13 | Disposition: A | Discharge status: Home / Self Care | Payer: PRIVATE HEALTH INSURANCE

## 2023-07-13 DIAGNOSIS — H1031 Unspecified acute conjunctivitis, right eye: Secondary | ICD-10-CM | POA: Diagnosis not present

## 2023-07-13 MED ORDER — ERYTHROMYCIN 5 MG/GM OP OINT: TOPICAL_OINTMENT | OPHTHALMIC | 0 refills | Status: DC

## 2023-07-13 NOTE — Discharge Instructions (Signed)
 Start using erythromycin ointment four times daily for 5 days for pink eye. Return here if symptoms persist or worsen.

## 2023-07-13 NOTE — ED Provider Notes (Signed)
 [facility]    CSN: [id number] Arrival date & time: 07/13/23  1456      History   Chief Complaint Chief Complaint  Patient presents with   Eye Pain    HPI Matthew Cunningham is a 53 y.o. male.   Patient presents with right eye irritation and drainage x 2 days. Patient reports waking up with his eye crusted shut with discharge this morning. Denies fever and swelling.    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 No pertinent surgical history.     Home Medications    Prior to Admission medications   Medication Sig Start Date End Date Taking? Authorizing Provider  erythromycin ophthalmic ointment Place a 1/2 inch ribbon of ointment into the lower eyelid four times daily for 5 days. 07/13/23  Yes Susann Givens, Teyona Nichelson A, NP  tamsulosin (FLOMAX) 0.4 MG CAPS capsule Take 0.4 mg by mouth daily. 03/27/23  Yes [provider]    Family History Family History  Problem Relation Age of Onset   Hypertension Paternal Grandmother    Hypertension Paternal Grandfather     Social History Social History   Tobacco Use   Smoking status: Never   Smokeless tobacco: Never  Vaping Use   Vaping status: Never Used  Substance Use Topics   Alcohol use: Yes    Comment: social drinker, once every two weeks   Drug use: No     Allergies   Patient has no known allergies.   Review of Systems Review of Systems  Eyes:  Positive for pain.   Per HPI  Physical Exam Triage Vital Signs ED  Triage Vitals  Encounter Vitals Group     BP 07/13/23 1549 (!) 146/87     Systolic BP Percentile --      Diastolic BP Percentile --      Pulse Rate 07/13/23 1549 92     Resp 07/13/23 1549 16     Temp 07/13/23 1549 99.1 F (37.3 C)     Temp Source 07/13/23 1549 Oral     SpO2 07/13/23 1549 92 %     Weight 07/13/23 1549 220 lb (99.8 kg)     Height 07/13/23 1549 5\' 8"  (1.727 m)     Head Circumference --      Peak Flow --      Pain Score 07/13/23 1548 3     Pain Loc --      Pain Education --      Exclude from Growth Chart --    No data found.  Updated Vital Signs BP (!) 146/87 (BP Location: Left Arm)   Pulse 92   Temp 99.1 F (37.3 C) (Oral)   Resp 16   Ht 5\' 8"  (1.727 m)   Wt 220 lb (99.8 kg)   SpO2 92%   BMI 33.45 kg/m   Visual Acuity Right Eye Distance:   Left Eye Distance:  Bilateral Distance:    Right Eye Near:   Left Eye Near:    Bilateral Near:     Physical Exam Vitals and nursing note reviewed.  Constitutional:      General: He is awake. He is not in acute distress.    Appearance: Normal appearance. He is well-developed and well-groomed. He is not ill-appearing.  Eyes:     Extraocular Movements: Extraocular movements intact.     Conjunctiva/sclera:     Right eye: Right conjunctiva is injected.     Pupils: Pupils are equal, round, and reactive to light.  Neurological:     Mental Status: He is alert.  Psychiatric:        Behavior: Behavior is cooperative.      UC Treatments / Results  Labs (all labs ordered are listed, but only abnormal results are displayed) Labs Reviewed - No data to display  EKG   Radiology No results found.  Procedures Procedures (including critical care time)  Medications Ordered in UC Medications - No data to display  Initial Impression / Assessment and Plan / UC Course  I have reviewed the triage vital signs and the nursing notes.  Pertinent labs & imaging results that were available during my care of the  patient were reviewed by me and considered in my medical decision making (see chart for details).     Patient presented with 2-day history of right eye irritation and drainage. Patient reports his eye was crusted shut with discharge this morning. Upon assessment right conjunctiva is injected. Prescribed erythromycin ointment for conjunctivitis coverage. Discussed return precautions.  Final Clinical Impressions(s) / UC Diagnoses   Final diagnoses:  Acute bacterial conjunctivitis of right eye     Discharge Instructions      Start using erythromycin ointment four times daily for 5 days for pink eye. Return here if symptoms persist or worsen.    ED Prescriptions     Medication Sig Dispense Auth. Provider   erythromycin ophthalmic ointment Place a 1/2 inch ribbon of ointment into the lower eyelid four times daily for 5 days. 3.5 g Wynonia Lawman A, NP      PDMP not reviewed this encounter.   Wynonia Lawman A, NP 07/13/23 [phone]

## 2023-07-13 NOTE — ED Triage Notes (Signed)
 Patient presenting with right eye irritation onset 2 days. States the eye is painful and draining. No known injuries or foreign objects. No known sick exposure.  Prescriptions or OTC medications tried: No

## 2024-01-15 ENCOUNTER — Encounter: Payer: Self-pay | Admitting: Gastroenterology

## 2024-03-05 ENCOUNTER — Encounter: Payer: Self-pay | Admitting: Gastroenterology

## 2024-03-05 ENCOUNTER — Ambulatory Visit: Payer: PRIVATE HEALTH INSURANCE | Admitting: Gastroenterology

## 2024-03-05 VITALS — BP 140/80 | HR 74 | Ht 67.0 in | Wt 224.0 lb

## 2024-03-05 DIAGNOSIS — K219 Gastro-esophageal reflux disease without esophagitis: Secondary | ICD-10-CM

## 2024-03-05 DIAGNOSIS — K649 Unspecified hemorrhoids: Secondary | ICD-10-CM | POA: Diagnosis not present

## 2024-03-05 DIAGNOSIS — Z1211 Encounter for screening for malignant neoplasm of colon: Secondary | ICD-10-CM

## 2024-03-05 DIAGNOSIS — K5900 Constipation, unspecified: Secondary | ICD-10-CM

## 2024-03-05 DIAGNOSIS — R140 Abdominal distension (gaseous): Secondary | ICD-10-CM

## 2024-03-05 MED ORDER — NA SULFATE-K SULFATE-MG SULF 17.5-3.13-1.6 GM/177ML PO SOLN: 1.0000 | Freq: Once | ORAL | 0 refills | Status: AC

## 2024-03-05 NOTE — Patient Instructions (Signed)
 Increase dietary fiber.   You have been scheduled for an endoscopy and colonoscopy. Please follow the written instructions given to you at your visit today.  If you use inhalers (even only as needed), please bring them with you on the day of your procedure.  DO NOT TAKE 7 DAYS PRIOR TO TEST- Trulicity (dulaglutide) Ozempic, Wegovy (semaglutide) Mounjaro (tirzepatide) Bydureon Bcise (exanatide extended release)  DO NOT TAKE 1 DAY PRIOR TO YOUR TEST Rybelsus (semaglutide) Adlyxin (lixisenatide) Victoza (liraglutide) Byetta (exanatide) ___________________________________________________________________________

## 2024-03-05 NOTE — Progress Notes (Signed)
 Matthew Cunningham [id number] 12/28/1970   Chief Complaint: Constipation and bloating  Referring Provider: Bari Morgans, NP Primary GI MD: Sampson  HPI: Matthew Cunningham is a 53 y.o. male with past medical history of GERD, anxiety/depression, hemorrhoids, HLD, HTN who presents today for a complaint of constipation and bloating.    Referred by [facility].  Per referral note, has been seen following a ED visit for chest discomfort.  Tests came back normal and he was diagnosed with GERD.  He requested referral for colonoscopy.  Seen in the ED 04/09/2023 for chest pain.  Reported intermittent chest pain over the past few years.  Seen by cardiologist and underwent stress EKG that was nonischemic December 2023.  Symptoms worse with stress at work.  Left-sided chest pain was radiating into his left shoulder.  Patient had complete symptom resolution after administration of Pepcid  and Toradol.  Had a normal EKG.  Normal troponins.   Discussed the use of AI scribe software for clinical note transcription with the patient, who gave verbal consent to proceed.  History of Present Illness Matthew Cunningham is a 53 year old male who presents with gastrointestinal issues, including reflux and constipation.  He has experienced reflux for several years, currently managed with omeprazole  (Prilosec) as needed, approximately two days every two weeks. No difficulty or pain with swallowing is reported. A previous episode of chest pain in November was evaluated in the ER, where his heart workup was normal. Denies recurrence of chest pain.  He experiences constipation and bloating, which he attributes to his diet, particularly high-fat, high-sugar foods, and dairy. He has bowel movements twice a day but occasionally experiences constipation and bloating. Increased water intake and a healthier diet improve his bowel regularity. He denies using any medications for constipation.  He recalls having blood  in his stool a few months ago, which was minimal and appeared on toilet paper. Testing confirmed the absence of blood in stool. He has a history of hemorrhoids, previously treated with topical medication.  He had a colonoscopy in 2012 but does not recall the reason for it. He has not had an upper endoscopy before.  He smokes cigars socially and denies regular cigarette smoking. He does not consume alcohol regularly. No family history of esophageal, stomach, or colon cancer is reported.   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 No pertinent surgical history.  Current Outpatient Medications  Medication Sig Dispense Refill   erythromycin  ophthalmic ointment Place a 1/2 inch ribbon of ointment into the lower eyelid four times daily for 5 days. 3.5 g 0   Na Sulfate-K Sulfate-Mg Sulfate concentrate (SUPREP) 17.5-3.13-1.6 GM/177ML SOLN Take 1 kit (354 mLs total) by mouth once for 1 dose. 354 mL 0   omeprazole  (PRILOSEC) 20 MG capsule Take 20 mg by mouth 2 (two) times daily as needed.     tamsulosin (FLOMAX) 0.4 MG CAPS capsule Take 0.4 mg by mouth daily.     No current facility-administered medications for this visit.    Allergies as  of 03/05/2024   (No Known Allergies)    Family History  Problem Relation Age of Onset   Hypertension Paternal Grandmother    Hypertension Paternal Grandfather     Social History   Tobacco Use   Smoking status: Never   Smokeless tobacco: Never  Vaping Use   Vaping status: Never Used  Substance Use Topics   Alcohol use: Yes    Comment: social drinker, once  every two weeks   Drug use: No     Review of Systems:    Constitutional: No weight loss, fever, chills Cardiovascular: No chest pain Respiratory: No SOB or cough Gastrointestinal: See HPI and otherwise negative   Physical Exam:  Vital signs: BP (!) 140/80   Pulse 74   Ht 5' 7 (1.702 m)   Wt 224 lb (101.6 kg)   BMI 35.08 kg/m   Constitutional: Pleasant, obese male in NAD, alert and cooperative Head:  Normocephalic and atraumatic.  Eyes: No scleral icterus.  Respiratory: Respirations even and unlabored. Lungs clear to auscultation bilaterally.  No wheezes, crackles, or rhonchi.  Cardiovascular:  Regular rate and rhythm. No murmurs. No peripheral edema. Gastrointestinal:  Soft, nondistended, nontender. No rebound or guarding. Normal bowel sounds. No appreciable masses or hepatomegaly. Rectal:  Not performed.  Neurologic:  Alert and oriented x4;  grossly normal neurologically.  Skin:   Dry and intact without significant lesions or rashes. Psychiatric: Oriented to person, place and time. Demonstrates good judgement and reason without abnormal affect or behaviors.   RELEVANT LABS AND IMAGING: CBC    Component Value Date/Time   WBC 9.8 04/09/2023 1915   RBC 5.77 04/09/2023 1915   HGB 15.4 04/09/2023 1915   HGB 15.3 06/17/2020 1115   HCT 47.7 04/09/2023 1915   HCT 47.1 06/17/2020 1115   PLT 274 04/09/2023 1915   PLT 278 06/17/2020 1115   MCV 82.7 04/09/2023 1915   MCV 82 06/17/2020 1115   MCH 26.7 04/09/2023 1915   MCHC 32.3 04/09/2023 1915   RDW 13.2 04/09/2023 1915   RDW 12.5 06/17/2020 1115   LYMPHSABS 2.5 06/17/2020 1115   MONOABS 0.8 10/27/2012 2056   EOSABS 0.0 06/17/2020 1115   BASOSABS 0.0 06/17/2020 1115    CMP     Component Value Date/Time   NA 136 04/09/2023 1915   NA 140 06/17/2020 1115   K 3.9 04/09/2023 1915   CL 103 04/09/2023 1915   CO2 23 04/09/2023 1915   GLUCOSE 88 04/09/2023 1915   BUN 13 04/09/2023 1915   BUN 11 06/17/2020 1115    CREATININE 1.37 (H) 04/09/2023 1915   CREATININE 1.42 (H) 11/11/2012 1008   CALCIUM 9.5 04/09/2023 1915   PROT 7.5 06/17/2020 1115   ALBUMIN 4.8 06/17/2020 1115   AST 30 06/17/2020 1115   ALT 41 06/17/2020 1115   ALKPHOS 107 06/17/2020 1115   BILITOT 0.6 06/17/2020 1115   GFRNONAA >60 04/09/2023 1915   GFRAA 70 06/17/2020 1115   Echocardiogram 03/01/2022 1. Normal LV systolic function with visual EF 60-65%. Left ventricle cavity is normal in size. Moderate concentric hypertrophy of the left ventricle. Normal global wall motion. Normal diastolic filling pattern, normal LAP. Calculated EF 68%. 2. Structurally normal tricuspid valve with trace regurgitation. No evidence of pulmonary hypertension. 3. No prior available for comparison.  Assessment/Plan:   Screening for colon cancer Patient due for colon cancer screening.  Last colonoscopy 2012.  He does not recall why this was done and does not recall having colon polyps.  Denies family history of colon cancer.  - Schedule colonoscopy. I thoroughly discussed the procedure with the patient to include nature of the procedure, alternatives, benefits, and risks (including but not limited to bleeding, infection, perforation, anesthesia/cardiac/pulmonary complications). Patient verbalized understanding and gave verbal consent to proceed with procedure.   GERD Patient reports longstanding history of GERD, for which he currently takes omeprazole  as needed.  Reports history of cigar smoking.  No prior EGD.  Was seen in the ED 03/2023 for chest pain and cardiac cause ruled out, thought to be related to GERD.    - Schedule EGD to be done with upcoming colonoscopy. - Continue omeprazole  as needed  Constipation Bloating Hemorrhoids Patient reports intermittent constipation and bloating, which is usually attributed to poor diet including foods high in sugar, high in fat, and dairy.  If he avoids these foods he has no issues.  Typically has a bowel  movement twice a day and denies any blood in his stool or melena.  He has noticed rare intermittent BRBPR, and reports history of hemorrhoids.  States he had a test to check for blood in the stool which was negative.  - Increase dietary fiber and water intake - Avoid foods that exacerbate symptoms   Camie Furbish, PA-C [facility] 03/05/2024, 2:42 PM  Patient Care Team: Bari Morgans, NP as PCP - General Nahser, Aleene PARAS, MD (Inactive) as PCP - Cardiology (Cardiology)

## 2024-03-12 NOTE — Progress Notes (Signed)
 ____________________________________________________________  Attending physician addendum:  Thank you for sending this case to me. I have reviewed the entire note and agree with the plan.   Victory Brand, MD  ____________________________________________________________

## 2024-03-25 ENCOUNTER — Encounter: Payer: Self-pay | Admitting: Gastroenterology

## 2024-04-01 ENCOUNTER — Ambulatory Visit: Admitting: Gastroenterology

## 2024-04-01 ENCOUNTER — Encounter: Payer: Self-pay | Admitting: Gastroenterology

## 2024-04-01 VITALS — BP 143/93 | HR 69 | Temp 98.0°F | Resp 13 | Ht 67.0 in | Wt 244.0 lb

## 2024-04-01 DIAGNOSIS — K2970 Gastritis, unspecified, without bleeding: Secondary | ICD-10-CM

## 2024-04-01 DIAGNOSIS — K5900 Constipation, unspecified: Secondary | ICD-10-CM

## 2024-04-01 DIAGNOSIS — K31A19 Gastric intestinal metaplasia without dysplasia, unspecified site: Secondary | ICD-10-CM

## 2024-04-01 DIAGNOSIS — K5730 Diverticulosis of large intestine without perforation or abscess without bleeding: Secondary | ICD-10-CM | POA: Diagnosis not present

## 2024-04-01 DIAGNOSIS — K219 Gastro-esophageal reflux disease without esophagitis: Secondary | ICD-10-CM

## 2024-04-01 DIAGNOSIS — K2950 Unspecified chronic gastritis without bleeding: Secondary | ICD-10-CM | POA: Diagnosis not present

## 2024-04-01 DIAGNOSIS — B9681 Helicobacter pylori [H. pylori] as the cause of diseases classified elsewhere: Secondary | ICD-10-CM | POA: Diagnosis not present

## 2024-04-01 DIAGNOSIS — K6289 Other specified diseases of anus and rectum: Secondary | ICD-10-CM

## 2024-04-01 DIAGNOSIS — D122 Benign neoplasm of ascending colon: Secondary | ICD-10-CM | POA: Diagnosis not present

## 2024-04-01 DIAGNOSIS — Z1211 Encounter for screening for malignant neoplasm of colon: Secondary | ICD-10-CM | POA: Diagnosis not present

## 2024-04-01 DIAGNOSIS — K648 Other hemorrhoids: Secondary | ICD-10-CM

## 2024-04-01 MED ORDER — SODIUM CHLORIDE 0.9 % IV SOLN: 500.0000 mL | INTRAVENOUS | Status: DC

## 2024-04-01 NOTE — Progress Notes (Signed)
 Vss nad trans to pacu

## 2024-04-01 NOTE — Progress Notes (Signed)
 Pt's states no medical or surgical changes since previsit or office visit.

## 2024-04-01 NOTE — Patient Instructions (Signed)
 Handouts given: Polyps, Hemorrhoids, Diverticulosis Resume previous diet. Continue present medications.  Follow an antireflux regimen indefinitely. Await pathology results. Repeat colonoscopy in 1 year for surveillance and fair preparation. GoLytely prep for next colonoscopy.  YOU HAD AN ENDOSCOPIC PROCEDURE TODAY AT THE [facility]:   Refer to the procedure report that was given to you for any specific questions about what was found during the examination.  If the procedure report does not answer your questions, please call your gastroenterologist to clarify.  If you requested that your care partner not be given the details of your procedure findings, then the procedure report has been included in a sealed envelope for you to review at your convenience later.  YOU SHOULD EXPECT: Some feelings of bloating in the abdomen. Passage of more gas than usual.  Walking can help get rid of the air that was put into your GI tract during the procedure and reduce the bloating. If you had a lower endoscopy (such as a colonoscopy or flexible sigmoidoscopy) you may notice spotting of blood in your stool or on the toilet paper. If you underwent a bowel prep for your procedure, you may not have a normal bowel movement for a few days.  Please Note:  You might notice some irritation and congestion in your nose or some drainage.  This is from the oxygen used during your procedure.  There is no need for concern and it should clear up in a day or so.  SYMPTOMS TO REPORT IMMEDIATELY:  Following lower endoscopy (colonoscopy or flexible sigmoidoscopy):  Excessive amounts of blood in the stool  Significant tenderness or worsening of abdominal pains  Swelling of the abdomen that is new, acute  Fever of 100F or higher  Following upper endoscopy (EGD)  Vomiting of blood or coffee ground material  New chest pain or pain under the shoulder blades  Painful or persistently difficult swallowing  New shortness  of breath  Fever of 100F or higher  Black, tarry-looking stools  For urgent or emergent issues, a gastroenterologist can be reached at any hour by calling [phone]. Do not use MyChart messaging for urgent concerns.    DIET:  We do recommend a small meal at first, but then you may proceed to your regular diet.  Drink plenty of fluids but you should avoid alcoholic beverages for 24 hours.  ACTIVITY:  You should plan to take it easy for the rest of today and you should NOT DRIVE or use heavy machinery until tomorrow (because of the sedation medicines used during the test).    FOLLOW UP: Our staff will call the number listed on your records the next business day following your procedure.  We will call around 7:15- 8:00 am to check on you and address any questions or concerns that you may have regarding the information given to you following your procedure. If we do not reach you, we will leave a message.     If any biopsies were taken you will be contacted by phone or by letter within the next 1-3 weeks.  Please call us  at [phone] if you have not heard about the biopsies in 3 weeks.    SIGNATURES/CONFIDENTIALITY: You and/or your care partner have signed paperwork which will be entered into your electronic medical record.  These signatures attest to the fact that that the information above on your After Visit Summary has been reviewed and is understood.  Full responsibility of the confidentiality of this discharge information lies  with you and/or your care-partner.

## 2024-04-01 NOTE — Progress Notes (Signed)
 No significant changes to clinical history since GI office visit on 03/05/24.  The patient is appropriate for an endoscopic procedure in the ambulatory setting.  - Victory Brand, MD

## 2024-04-01 NOTE — Op Note (Signed)
 [facility] Patient Name: Matthew Cunningham Procedure Date: 04/01/2024 2:26 PM MRN: [id number] Endoscopist: Victory L. Legrand , MD, [id number] Age: 53 Referring MD:  Date of Birth: 12-10-1970 Gender: Male Account #: [id number] Procedure:                Colonoscopy Indications:              Screening for colorectal malignant neoplasm, This                            is the patient's first screening colonoscopy Medicines:                Monitored Anesthesia Care Procedure:                Pre-Anesthesia Assessment:                           - Prior to the procedure, a History and Physical                            was performed, and patient medications and                            allergies were reviewed. The patient's tolerance of                            previous anesthesia was also reviewed. The risks                            and benefits of the procedure and the sedation                            options and risks were discussed with the patient.                            All questions were answered, and informed consent                            was obtained. Prior Anticoagulants: The patient has                            taken no anticoagulant or antiplatelet agents. ASA                            Grade Assessment: II - A patient with mild systemic                            disease. After reviewing the risks and benefits,                            the patient was deemed in satisfactory condition to                            undergo the procedure.  After obtaining informed consent, the colonoscope                            was passed under direct vision. Throughout the                            procedure, the patient's blood pressure, pulse, and                            oxygen saturations were monitored continuously. The                            CF [id number] #[id number] was introduced through the anus                            and  advanced to the the cecum, identified by                            appendiceal orifice and ileocecal valve. The                            colonoscopy was somewhat difficult due to a                            redundant colon. Successful completion of the                            procedure was aided by straightening and shortening                            the scope to obtain bowel loop reduction and                            lavage. The patient tolerated the procedure well.                            The quality of the bowel preparation was adequate                            in much of the colon but fair throughout the cecum                            and ascending colon despite lavage. The ileocecal                            valve, appendiceal orifice, and rectum were                            photographed. The bowel preparation used was SUPREP. Scope In: 3:58:25 PM Scope Out: 4:13:46 PM Scope Withdrawal Time: 0 hours 11 minutes 52 seconds  Total Procedure Duration: 0 hours 15 minutes 21 seconds  Findings:                 The  perianal and digital rectal examinations were                            normal.                           Repeat examination of right colon under NBI                            performed.                           Two sessile polyps were found in the ascending                            colon. The polyps were diminutive in size. These                            polyps were removed with a cold snare. Resection                            and retrieval were complete.                           Multiple diverticula were found in the left colon.                           Internal hemorrhoids were found. The hemorrhoids                            were small.                           Anal papilla(e) were hypertrophied.                           The exam was otherwise without abnormality on                            direct and retroflexion views. Complications:             No immediate complications. Estimated Blood Loss:     Estimated blood loss was minimal. Impression:               - Two diminutive polyps in the ascending colon,                            removed with a cold snare. Resected and retrieved.                           - Diverticulosis in the left colon.                           - Internal hemorrhoids.                           - Anal papilla(e) were hypertrophied.                           -  The examination was otherwise normal on direct                            and retroflexion views. Recommendation:           - Patient has a contact number available for                            emergencies. The signs and symptoms of potential                            delayed complications were discussed with the                            patient. Return to normal activities tomorrow.                            Written discharge instructions were provided to the                            patient.                           - Resume previous diet.                           - Continue present medications.                           - Await pathology results.                           - Repeat colonoscopy in 1 year for surveillance and                            fair preparation (see above). GoLytely prep for                            next colonoscopy.                           - See the other procedure note for documentation of                            additional recommendations. Kayden Hutmacher L. Legrand, MD 04/01/2024 4:23:51 PM This report has been signed electronically.

## 2024-04-01 NOTE — Op Note (Signed)
 [facility] Patient Name: Matthew Cunningham Procedure Date: 04/01/2024 3:31 PM MRN: [id number] Endoscopist: Victory L. Legrand , MD, [id number] Age: 53 Referring MD:  Date of Birth: Jun 19, 1970 Gender: Male Account #: [id number] Procedure:                Upper GI endoscopy Indications:              Heartburn, Screening for Barrett's esophagus                           intermittent GERD symptoms, uses acid suppression                            medicine as needed Medicines:                Monitored Anesthesia Care Procedure:                Pre-Anesthesia Assessment:                           - Prior to the procedure, a History and Physical                            was performed, and patient medications and                            allergies were reviewed. The patient's tolerance of                            previous anesthesia was also reviewed. The risks                            and benefits of the procedure and the sedation                            options and risks were discussed with the patient.                            All questions were answered, and informed consent                            was obtained. Prior Anticoagulants: The patient has                            taken no anticoagulant or antiplatelet agents. ASA                            Grade Assessment: II - A patient with mild systemic                            disease. After reviewing the risks and benefits,                            the patient was deemed in satisfactory condition to  undergo the procedure.                           After obtaining informed consent, the endoscope was                            passed under direct vision. Throughout the                            procedure, the patient's blood pressure, pulse, and                            oxygen saturations were monitored continuously. The                            Olympus Scope [id number] was  introduced through the                            mouth, and advanced to the second part of duodenum.                            The upper GI endoscopy was accomplished without                            difficulty. The patient tolerated the procedure                            well. Scope In: Scope Out: Findings:                 The larynx was normal.                           There is no endoscopic evidence of Barrett's                            esophagus, esophagitis, hiatal hernia or stricture                            in the entire esophagus.                           The esophagus was normal.                           Patchy atrophic mucosa was found in the gastric                            body. Several biopsies were obtained on the greater                            curvature of the gastric body, on the lesser                            curvature of the gastric body (jar 2), on the  greater curvature of the gastric antrum and on the                            lesser curvature of the gastric antrum (jar 1) with                            cold forceps for histology. (Evaluate for H. pylori                            and autoimmune gastritis)                           The exam of the stomach was otherwise normal. Complications:            No immediate complications. Estimated Blood Loss:     Estimated blood loss was minimal. Impression:               - Normal larynx.                           - Normal esophagus.                           - Gastric mucosal atrophy. Unclear if any clinical                            significance, biopsies pending.                           - Several biopsies were obtained on the greater                            curvature of the gastric body, on the lesser                            curvature of the gastric body, on the greater                            curvature of the gastric antrum and on the lesser                             curvature of the gastric antrum. Recommendation:           - Patient has a contact number available for                            emergencies. The signs and symptoms of potential                            delayed complications were discussed with the                            patient. Return to normal activities tomorrow.  Written discharge instructions were provided to the                            patient.                           - Resume previous diet.                           - Continue present medications.                           - Follow an antireflux regimen indefinitely.                           - Await pathology results.                           - See the other procedure note for documentation of                            additional recommendations. Alona Danford L. Legrand, MD 04/01/2024 4:19:18 PM This report has been signed electronically.

## 2024-04-02 ENCOUNTER — Telehealth: Payer: Self-pay

## 2024-04-02 NOTE — Telephone Encounter (Signed)
  Follow up Call-     04/01/2024    2:53 PM  Call back number  Post procedure Call Back phone  # [phone]  Permission to leave phone message Yes     Patient questions:  Do you have a fever, pain , or abdominal swelling? No. Pain Score  0 *  Have you tolerated food without any problems? Yes.    Have you been able to return to your normal activities? Yes.    Do you have any questions about your discharge instructions: Diet   No. Medications  No. Follow up visit  No.  Do you have questions or concerns about your Care? No.  Actions: * If pain score is 4 or above: No action needed, pain <4.

## 2024-04-07 ENCOUNTER — Ambulatory Visit: Payer: Self-pay | Admitting: Gastroenterology

## 2024-04-07 LAB — SURGICAL PATHOLOGY

## 2024-04-12 MED ORDER — BISMUTH SUBSALICYLATE 262 MG PO TABS: 2.0000 | ORAL_TABLET | Freq: Four times a day (QID) | ORAL | 0 refills | Status: AC

## 2024-04-12 MED ORDER — METRONIDAZOLE 250 MG PO TABS: 250.0000 mg | ORAL_TABLET | Freq: Four times a day (QID) | ORAL | 0 refills | Status: AC

## 2024-04-12 MED ORDER — TETRACYCLINE HCL 500 MG PO CAPS: 500.0000 mg | ORAL_CAPSULE | Freq: Four times a day (QID) | ORAL | 0 refills | Status: AC

## 2024-04-24 ENCOUNTER — Ambulatory Visit
Admission: EM | Admit: 2024-04-24 | Discharge: 2024-04-24 | Disposition: A | Discharge status: Home / Self Care | Source: Home / Self Care

## 2024-04-24 ENCOUNTER — Encounter: Payer: Self-pay | Admitting: Emergency Medicine

## 2024-04-24 DIAGNOSIS — K649 Unspecified hemorrhoids: Secondary | ICD-10-CM

## 2024-04-24 DIAGNOSIS — K625 Hemorrhage of anus and rectum: Secondary | ICD-10-CM

## 2024-04-24 NOTE — Discharge Instructions (Addendum)
 Preparation H products OTC, epsom salt soaks in warm water

## 2024-04-24 NOTE — ED Triage Notes (Signed)
 Pt presents c/o blood in stools x 2 days. Pt states,  I recently had a colonoscopy and they had me on a bacteria medication. The blood in stool started a few days after I started the medication. A couple days I didn't eat like I was supposed to and I think that contributed to what I got going on. Pt denies any additional sxs.

## 2024-04-25 NOTE — ED Provider Notes (Signed)
 [name]    CSN: [id number] Arrival date & time: 04/24/24  1346      History   Chief Complaint Chief Complaint  Patient presents with   Blood In Stools    HPI Matthew Cunningham is a 53 y.o. male.   Pt presents today due to 2 days worth of bright red blood per rectum. Pt states that he recently had a colonoscopy has was found to have hemorrhoids. Pt denies that he is experiencing black tarry stools or maroon colored stools. Pt states that he has been experiencing more frequent bowel movements as he has been using Miralax to keep himself from being constipated.    The history is provided by the patient.    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 Authorizing Provider  Bismuth  Subsalicylate 262 MG TABS Take 2 tablets (524 mg total) by mouth in the morning, at noon, in the evening, and at bedtime for 14 days. 04/12/24 04/26/24 Yes Danis, Victory LITTIE MOULD, MD  metroNIDAZOLE  (FLAGYL ) 250 MG tablet Take 1 tablet (250 mg total) by mouth 4 (four) times daily for 14 days. 04/12/24 04/26/24 Yes Danis, Victory LITTIE MOULD, MD  tetracycline  (SUMYCIN ) 500 MG capsule Take 1 capsule (500 mg total) by mouth 4 (four) times daily for 14 days. 04/12/24 04/26/24 Yes Danis, Victory LITTIE MOULD, MD  omeprazole  (PRILOSEC) 20 MG capsule Take 20 mg by mouth 2 (two) times daily as needed.  01/04/24   [provider]  tamsulosin (FLOMAX) 0.4 MG CAPS capsule Take 0.4 mg by mouth daily. 03/27/23   [provider]    Family History Family History  Problem Relation Age of Onset   Hypertension Paternal Grandmother    Hypertension Paternal Grandfather    Colon cancer Neg Hx    Esophageal cancer Neg Hx    Rectal cancer Neg Hx    Stomach cancer Neg Hx     Social History Social History[1]   Allergies   Patient has no known allergies.   Review of Systems Review of Systems   Physical Exam Triage Vital Signs ED Triage Vitals  Encounter Vitals Group     BP 04/24/24 1434 121/80     Girls Systolic BP Percentile --      Girls Diastolic BP Percentile --      Boys Systolic BP Percentile --      Boys Diastolic BP Percentile --      Pulse Rate 04/24/24 1434 72     Resp 04/24/24 1434 18     Temp 04/24/24 1434 99.7 F (37.6 C)     Temp Source 04/24/24 1434 Oral     SpO2 04/24/24 1434 95 %     Weight 04/24/24 1433 244 lb 0.8 oz (110.7 kg)  Height --      Head Circumference --      Peak Flow --      Pain Score 04/24/24 1433 0     Pain Loc --      Pain Education --      Exclude from Growth Chart --    No data found.  Updated Vital Signs BP 121/80 (BP Location: Left Arm)   Pulse 72   Temp 99.7 F (37.6 C) (Oral)   Resp 18   Wt 244 lb 0.8 oz (110.7 kg)   SpO2 95%   BMI 38.22 kg/m   Visual Acuity Right Eye Distance:   Left Eye Distance:   Bilateral Distance:    Right Eye Near:   Left Eye Near:    Bilateral Near:     Physical Exam Vitals and nursing note reviewed.  Constitutional:      General: He is not in acute distress.    Appearance: Normal appearance. He is not ill-appearing, toxic-appearing or diaphoretic.  Eyes:     General: No scleral icterus. Cardiovascular:     Rate and Rhythm: Normal rate and regular rhythm.     Heart sounds: Normal heart sounds.  Pulmonary:     Effort: Pulmonary effort is normal. No respiratory  distress.     Breath sounds: Normal breath sounds. No wheezing or rhonchi.  Abdominal:     General: Abdomen is flat. Bowel sounds are normal.     Palpations: Abdomen is soft.     Tenderness: There is abdominal tenderness in the right upper quadrant and right lower quadrant. There is no right CVA tenderness or left CVA tenderness.  Skin:    General: Skin is warm.  Neurological:     Mental Status: He is alert and oriented to person, place, and time.  Psychiatric:        Mood and Affect: Mood normal.        Behavior: Behavior normal.      UC Treatments / Results  Labs (all labs ordered are listed, but only abnormal results are displayed) Labs Reviewed - No data to display  EKG   Radiology No results found.  Procedures Procedures (including critical care time)  Medications Ordered in UC Medications - No data to display  Initial Impression / Assessment and Plan / UC Course  I have reviewed the triage vital signs and the nursing notes.  Pertinent labs & imaging results that were available during my care of the patient were reviewed by me and considered in my medical decision making (see chart for details).     Final Clinical Impressions(s) / UC Diagnoses   Final diagnoses:  Hemorrhoids, unspecified hemorrhoid type  Bright red blood per rectum     Discharge Instructions      Preparation H products OTC, epsom salt soaks in warm water    ED Prescriptions   None    PDMP not reviewed this encounter.    [1]  Social History Tobacco Use   Smoking status: Never    Passive exposure: Never   Smokeless tobacco: Never  Vaping Use   Vaping status: Never Used  Substance Use Topics   Alcohol use: Yes    Comment: social drinker, once every two weeks   Drug use: No     Andra Corean BROCKS, PA-C 04/25/24 0831

## 2024-05-27 ENCOUNTER — Other Ambulatory Visit: Payer: Self-pay

## 2024-05-27 ENCOUNTER — Telehealth: Payer: Self-pay

## 2024-05-27 DIAGNOSIS — A048 Other specified bacterial intestinal infections: Secondary | ICD-10-CM

## 2024-05-27 NOTE — Telephone Encounter (Signed)
 Patient notified to come to the office for the [employer] H Pylori test kit. Patient is advised and agrees to this plan of care. No further questions at this time.
